# Patient Record
Sex: Female | Born: 1955
Health system: Southern US, Community
[De-identification: ages and names within clinical notes are randomized; demographics above are authoritative.]

## PROBLEM LIST (undated history)

## (undated) DIAGNOSIS — M1990 Unspecified osteoarthritis, unspecified site: Secondary | ICD-10-CM

## (undated) DIAGNOSIS — I10 Essential (primary) hypertension: Secondary | ICD-10-CM

## (undated) DIAGNOSIS — G43909 Migraine, unspecified, not intractable, without status migrainosus: Secondary | ICD-10-CM

## (undated) DIAGNOSIS — E559 Vitamin D deficiency, unspecified: Secondary | ICD-10-CM

## (undated) DIAGNOSIS — K769 Liver disease, unspecified: Secondary | ICD-10-CM

## (undated) DIAGNOSIS — J45909 Unspecified asthma, uncomplicated: Secondary | ICD-10-CM

## (undated) DIAGNOSIS — I472 Ventricular tachycardia, unspecified: Secondary | ICD-10-CM

## (undated) DIAGNOSIS — J40 Bronchitis, not specified as acute or chronic: Secondary | ICD-10-CM

## (undated) DIAGNOSIS — E785 Hyperlipidemia, unspecified: Secondary | ICD-10-CM

## (undated) DIAGNOSIS — J309 Allergic rhinitis, unspecified: Secondary | ICD-10-CM

## (undated) DIAGNOSIS — R1013 Epigastric pain: Secondary | ICD-10-CM

## (undated) DIAGNOSIS — R002 Palpitations: Secondary | ICD-10-CM

## (undated) DIAGNOSIS — I471 Supraventricular tachycardia, unspecified: Secondary | ICD-10-CM

## (undated) DIAGNOSIS — H8110 Benign paroxysmal vertigo, unspecified ear: Secondary | ICD-10-CM

## (undated) DIAGNOSIS — D689 Coagulation defect, unspecified: Secondary | ICD-10-CM

## (undated) DIAGNOSIS — I4891 Unspecified atrial fibrillation: Secondary | ICD-10-CM

## (undated) DIAGNOSIS — G2581 Restless legs syndrome: Secondary | ICD-10-CM

## (undated) HISTORY — DX: Palpitations: R00.2

## (undated) HISTORY — DX: Unspecified asthma, uncomplicated: J45.909

## (undated) HISTORY — DX: Coagulation defect, unspecified: D68.9

## (undated) HISTORY — PX: LAPAROSCOPIC OOPHERECTOMY: SHX6507

## (undated) HISTORY — PX: COLONOSCOPY: SHX174

## (undated) HISTORY — DX: Ventricular tachycardia: I47.2

## (undated) HISTORY — DX: Unspecified osteoarthritis, unspecified site: M19.90

## (undated) HISTORY — DX: Allergic rhinitis, unspecified: J30.9

## (undated) HISTORY — PX: KNEE SURGERY: SHX244

## (undated) HISTORY — PX: ABLATION: SHX5711

## (undated) HISTORY — DX: Bronchitis, not specified as acute or chronic: J40

## (undated) HISTORY — DX: Epigastric pain: R10.13

## (undated) HISTORY — DX: Unspecified atrial fibrillation: I48.91

## (undated) HISTORY — DX: Vitamin D deficiency, unspecified: E55.9

## (undated) HISTORY — DX: Ventricular tachycardia, unspecified: I47.20

## (undated) HISTORY — PX: TONSILLECTOMY: SUR1361

## (undated) HISTORY — DX: Restless legs syndrome: G25.81

## (undated) HISTORY — DX: Migraine, unspecified, not intractable, without status migrainosus: G43.909

---

## 2005-05-14 ENCOUNTER — Ambulatory Visit: Payer: Self-pay | Admitting: Obstetrics and Gynecology

## 2006-01-18 ENCOUNTER — Ambulatory Visit: Payer: Self-pay | Admitting: Gastroenterology

## 2006-11-25 ENCOUNTER — Ambulatory Visit: Payer: Self-pay | Admitting: Obstetrics and Gynecology

## 2007-12-15 ENCOUNTER — Ambulatory Visit: Payer: Self-pay | Admitting: Obstetrics and Gynecology

## 2009-06-13 ENCOUNTER — Ambulatory Visit: Payer: Self-pay | Admitting: Obstetrics and Gynecology

## 2010-10-16 ENCOUNTER — Ambulatory Visit: Payer: Self-pay | Admitting: Internal Medicine

## 2014-08-25 ENCOUNTER — Emergency Department: Payer: Self-pay | Admitting: Emergency Medicine

## 2015-12-23 ENCOUNTER — Ambulatory Visit (INDEPENDENT_AMBULATORY_CARE_PROVIDER_SITE_OTHER): Payer: 59 | Admitting: Cardiology

## 2015-12-23 ENCOUNTER — Encounter (INDEPENDENT_AMBULATORY_CARE_PROVIDER_SITE_OTHER): Payer: Self-pay

## 2015-12-23 ENCOUNTER — Encounter: Payer: Self-pay | Admitting: Cardiology

## 2015-12-23 VITALS — BP 114/64 | HR 77 | Ht 64.0 in | Wt 183.8 lb

## 2015-12-23 DIAGNOSIS — R002 Palpitations: Secondary | ICD-10-CM

## 2015-12-23 DIAGNOSIS — I471 Supraventricular tachycardia: Secondary | ICD-10-CM | POA: Diagnosis not present

## 2015-12-23 DIAGNOSIS — I480 Paroxysmal atrial fibrillation: Secondary | ICD-10-CM

## 2015-12-23 MED ORDER — VERAPAMIL HCL 120 MG PO TABS: 120.0000 mg | ORAL_TABLET | Freq: Once | ORAL | Status: DC

## 2015-12-23 MED ORDER — VERAPAMIL HCL ER 120 MG PO TBCR
120.0000 mg | EXTENDED_RELEASE_TABLET | Freq: Every day | ORAL | Status: DC
Start: 2015-12-23 — End: 2016-05-03

## 2015-12-23 NOTE — Progress Notes (Signed)
Cardiology Office Note   Date:  12/23/2015   ID:  Diane Howe, DOB 1956/01/31, MRN [id number]  Referring Doctor:  Lynnea Ferrier, MD   Cardiologist:   Almond Lint, MD   Reason for consultation:  Chief Complaint  Patient presents with  . other    Palpitations . Meds reviewed verbally with pt.      History of Present Illness: Diane Howe is a 60 y.o. female who presents for Palpitations. Patient would like to establish care with cardiology for history of SVT.  Patient reports a long history with arrhythmia. Beginning in her 33s, she was diagnosed with SVT, and atrial fibrillation. She was follow-up with cardiology at [facility] for these. After failed medical therapy for the SVT, she underwent ablation. Based on medical records, first ablation was made 2003. They determined that arrhythmia was coming from the left side and therefore set her up for another ablation 04/24/2002.  From a visit with Dr. Kennedy Bucker, cardiology at [facility], 08/30/2006: "In May of 2003, she underwent an electrophysiology study for evaluation of a supraventricular tachycardia. Left atrial tachycardia was identified at EP study. Its successful ablation would have required transseptal puncture and it was elected to defer the procedure. The patient was discharged on Verapamil SR 240 mg per day and Atenolol 25 mg per day. The patient was readmitted on 04/24/02 and underwent an ablation. The ablation site was close to the hisbundal. The patient was observed following the procedure and maintained normal conduction. She was discharged home without further medication. "  According to the patient, after the ablation, she noted recurrence of palpitations and SVT. She needed to be on medications for a while. Afterwards, she did well for several years without medications for the SVT. Recently, she has needed when necessary verapamil for episodes of palpitations which she feels is similar to her SVT episodes in the past. This was  under the setting of increased stress at work. Verapamil will improve her palpitations. Her palpitations are described as rapid heart rate, symptoms mainly in the chest, nonradiating, moderate in intensity, lasting minutes at a time, improved with verapamil.  Patient describes another arrhythmia which she attributes to atrial fibrillation. Per medical records, she has paroxysmal atrial fibrillation. She describes this as a sensation of fluttering in her chest associated with lightheadedness. The symptoms last less than 1 minute. Verapamil also helps resolve the symptom. No associated chest pain or shortness of breath. She has been on aspirin for this.  Recently, she underwent stress echocardiogram and Holter for recurrence of palpitations. She was sent to cardiology for further management.   ROS:  Please see the history of present illness. Aside from mentioned under HPI, all other systems are reviewed and negative.     Past Medical History  Diagnosis Date  . A-fib (HCC)   . V tach (HCC)   . Clotting disorder (HCC)   . Asthma   . Bronchitis   . Dyspepsia   . Restless leg syndrome   . Vitamin D deficiency   . Heart palpitations   . Arthritis    She mentions a history of ventricular tachycardia but upon review of extensive medical records from [facility],/cardiology notes, no mention of history ventricular tachycardia  Past Surgical History  Procedure Laterality Date  . Ablation       reports that she has quit smoking. She does not have any smokeless tobacco history on file. She reports that she drinks alcohol. She reports that she does  not use illicit drugs.   family history includes Heart attack in her cousin and maternal grandfather.   Current Outpatient Prescriptions  Medication Sig Dispense Refill  . albuterol (PROVENTIL HFA;VENTOLIN HFA) 108 (90 Base) MCG/ACT inhaler Inhale into the lungs every 6 (six) hours as needed for wheezing or shortness of breath.    Marland Kitchen ascorbic acid (VITAMIN  C) 1000 MG tablet Take 1,000 mg by mouth daily.    Marland Kitchen aspirin 325 MG tablet Take 325 mg by mouth daily.    Marland Kitchen BIOGAIA PROBIOTIC (BIOGAIA PROBIOTIC) LIQD Take by mouth daily at 8 pm.    . busPIRone (BUSPAR) 15 MG tablet Take 7.5 mg by mouth 2 (two) times daily as needed.    . Calcium-Magnesium-Vitamin D 400-166.7-133.3 MG-MG-UNIT TABS Take by mouth daily.    . cholecalciferol (VITAMIN D) 1000 units tablet Take 4,000 Units by mouth daily.    Marland Kitchen etodolac (LODINE) 500 MG tablet Take 500 mg by mouth 2 (two) times daily as needed.    . fexofenadine (ALLEGRA) 180 MG tablet Take 180 mg by mouth daily.    . Multiple Vitamin (MULTIVITAMIN) capsule Take 1 capsule by mouth daily.    . peppermint oil liquid by Does not apply route daily.    . vitamin E 200 UNIT capsule Take 200 Units by mouth daily.    . verapamil (CALAN-SR) 120 MG CR tablet Take 1 tablet (120 mg total) by mouth at bedtime. 30 tablet 6   No current facility-administered medications for this visit.    Allergies: Omeprazole and Diphenhydramine    PHYSICAL EXAM: VS:  BP 114/64 mmHg  Pulse 77  Ht  (1.626 m)  Wt 183 lb 12 oz (83.348 kg)  BMI 31.52 kg/m2 , Body mass index is 31.52 kg/(m^2). Wt Readings from Last 3 Encounters:  12/23/15 183 lb 12 oz (83.348 kg)    GENERAL:  well developed, well nourished, obese, not in acute distress HEENT: normocephalic, pink conjunctivae, anicteric sclerae, no xanthelasma, normal dentition, oropharynx clear NECK:  no neck vein engorgement, JVP normal, no hepatojugular reflux, carotid upstroke brisk and symmetric, no bruit, no thyromegaly, no lymphadenopathy LUNGS:  good respiratory effort, clear to auscultation bilaterally CV:  PMI not displaced, no thrills, no lifts, S1 and S2 within normal limits, no palpable S3 or S4, no murmurs, no rubs, no gallops ABD:  Soft, nontender, nondistended, normoactive bowel sounds, no abdominal aortic bruit, no hepatomegaly, no splenomegaly MS: nontender back, no  kyphosis, no scoliosis, no joint deformities EXT:  2+ DP/PT pulses, no edema, no varicosities, no cyanosis, no clubbing SKIN: warm, nondiaphoretic, normal turgor, no ulcers NEUROPSYCH: alert, oriented to person, place, and time, sensory/motor grossly intact, normal mood, appropriate affect  Recent Labs: No results found for requested labs within last 365 days.   Lipid Panel No results found for: CHOL, TRIG, HDL, CHOLHDL, VLDL, LDLCALC, LDLDIRECT   Other studies Reviewed:  EKG:  EKG Is ordered today. The ekg ordered 12/23/2015 was personally reviewed by me and it revealed sinus rhythm, first-degree AV block. PR interval 232 ms. Minimal voltage criteria for LVH.  Additional studies/ records that were reviewed personally reviewed by me today include:   Holter monitor to 15 2017 as ordered by Dr. Graciela Husbands: Predominant sinus rhythm with periods of sinus bradycardia, sinus tachycardia cannot exclude P on T at higher rates. Sinus arrhythmia. Intermittent first degree AV block. Episode SVT sustained rate ranges approximate 127-165 bpm. Brief episode multifocal atrial tachycardia. Rare PVCs. NN the delapys due to  sinus arrhythmia and compensatory pauses.  Stress echocardiogram 11/15/2015: Normal stress echocardiogram. Normal RV systolic function. Mild valvular regurgitation. No valvular stenosis noted. SVT noted in late exercise. Rhythm strips were not available for review   ASSESSMENT AND PLAN:  Palpitations, SVT Patient will like to go back on medication for this since she has noted more recurrence lately. Recommend verapamil SR 120 mg daily. Recommend a Holter monitor one week after being on this medication. Recommend formal echocardiogram.  Paroxysmal atrial fibrillation Based on review of medical records. CHADS2-VASc= 1 for age. Continue aspirin for now. We'll be starting on verapamil SR daily.     Current medicines are reviewed at length with the patient today.  The patient does not  have concerns regarding medicines.  Labs/ tests ordered today include:  Orders Placed This Encounter  Procedures  . Holter monitor - 24 hour  . EKG 12-Lead  . Echocardiogram    I had a lengthy and detailed discussion with the patient regarding diagnoses, prognosis, diagnostic options, treatment options, and side effects of medications.   I counseled the patient on importance of lifestyle modification including heart healthy diet, regular physical activity. Disposition:   FU with undersigned after tests/In 2 months  I spent at least 60 minutes with the patient today and more than 50% of the time was spent counseling the patient and coordinating care. Significant amount of time was done as well to review extensive medical records from cardiology. This was done in the presence of the patient.     Signed, Almond LintAileen Novella Abraha, MD  12/23/2015 4:50 PM    [facility]

## 2015-12-23 NOTE — Patient Instructions (Addendum)
Medication Instructions:  Your physician has recommended you make the following change in your medication:   Changed verapamil to extended release 120 mg once daily   Labwork: None ordered  Testing/Procedures: Your physician has recommended that you wear a holter monitor. Holter monitors are medical devices that record the heart's electrical activity. Doctors most often use these monitors to diagnose arrhythmias. Arrhythmias are problems with the speed or rhythm of the heartbeat. The monitor is a small, portable device. You can wear one while you do your normal daily activities. This is usually used to diagnose what is causing palpitations/syncope (passing out).  Your physician has requested that you have an echocardiogram. Echocardiography is a painless test that uses sound waves to create images of your heart. It provides your doctor with information about the size and shape of your heart and how well your heart's chambers and valves are working. This procedure takes approximately one hour. There are no restrictions for this procedure.  Date & Time: __________________________________________________________________  Follow-Up: Your physician recommends that you schedule a follow-up appointment in: 2 months with Dr. Alvino Chapel  Date & Time: __________________________________________________________________   Any Other Special Instructions Will Be Listed Below (If Applicable).     If you need a refill on your cardiac medications before your next appointment, please call your pharmacy.  Echocardiogram An echocardiogram, or echocardiography, uses sound waves (ultrasound) to produce an image of your heart. The echocardiogram is simple, painless, obtained within a short period of time, and offers valuable information to your health care provider. The images from an echocardiogram can provide information such as:  Evidence of coronary artery disease (CAD).  Heart size.  Heart muscle  function.  Heart valve function.  Aneurysm detection.  Evidence of a past heart attack.  Fluid buildup around the heart.  Heart muscle thickening.  Assess heart valve function. LET [facility] CARE PROVIDER KNOW ABOUT:  Any allergies you have.  All medicines you are taking, including vitamins, herbs, eye drops, creams, and over-the-counter medicines.  Previous problems you or members of your family have had with the use of anesthetics.  Any blood disorders you have.  Previous surgeries you have had.  Medical conditions you have.  Possibility of pregnancy, if this applies. BEFORE THE PROCEDURE  No special preparation is needed. Eat and drink normally.  PROCEDURE   In order to produce an image of your heart, gel will be applied to your chest and a wand-like tool (transducer) will be moved over your chest. The gel will help transmit the sound waves from the transducer. The sound waves will harmlessly bounce off your heart to allow the heart images to be captured in real-time motion. These images will then be recorded.  You may need an IV to receive a medicine that improves the quality of the pictures. AFTER THE PROCEDURE You may return to your normal schedule including diet, activities, and medicines, unless your health care provider tells you otherwise.   This information is not intended to replace advice given to you by your health care provider. Make sure you discuss any questions you have with your health care provider.   Document Released: 09/11/2000 Document Revised: 10/05/2014 Document Reviewed: 05/22/2013 Elsevier Interactive Patient Education 2016 Elsevier Inc.     Holter Monitoring A Holter monitor is a small device that is used to detect abnormal heart rhythms. It clips to your clothing and is connected by wires to flat, sticky disks (electrodes) that attach to your chest. It is worn continuously  for 24-48 hours. HOME CARE INSTRUCTIONS  Wear your Holter  monitor at all times, even while exercising and sleeping, for as long as directed by your health care provider.  Make sure that the Holter monitor is safely clipped to your clothing or close to your body as recommended by your health care provider.  Do not get the monitor or wires wet.  Do not put body lotion or moisturizer on your chest.  Keep your skin clean.  Keep a diary of your daily activities, such as walking and doing chores. If you feel that your heartbeat is abnormal or that your heart is fluttering or skipping a beat:  Record what you are doing when it happens.  Record what time of day the symptoms occur.  Return your Holter monitor as directed by your health care provider.  Keep all follow-up visits as directed by your health care provider. This is important. SEEK IMMEDIATE MEDICAL CARE IF:  You feel lightheaded or you faint.  You have trouble breathing.  You feel pain in your chest, upper arm, or jaw.  You feel sick to your stomach and your skin is pale, cool, or damp.  You heartbeat feels unusual or abnormal.   This information is not intended to replace advice given to you by your health care provider. Make sure you discuss any questions you have with your health care provider.   Document Released: 06/12/2004 Document Revised: 10/05/2014 Document Reviewed: 04/23/2014 Elsevier Interactive Patient Education [employer] Else[employer] Inc[employer]

## 2015-12-25 ENCOUNTER — Telehealth: Payer: Self-pay | Admitting: Cardiology

## 2015-12-25 NOTE — Telephone Encounter (Signed)
Pt needs Rx: Verapamil 120 SR, qty 1 week, [facility], [name], [place].  She normally gets Verapamil, not SR, through mail order.

## 2015-12-25 NOTE — Telephone Encounter (Signed)
Left voicemail message for patient to call back regarding prescription.

## 2015-12-25 NOTE — Telephone Encounter (Signed)
Spoke with patient and she stated that she received a call from [facility] stated that her medication was available for pick up but it was not the correct one. Called and spoke with Crystal at [facility] and they received her prescription and mailed it out today. Notified patient to call us if she had any other problems.

## 2016-01-02 ENCOUNTER — Ambulatory Visit (INDEPENDENT_AMBULATORY_CARE_PROVIDER_SITE_OTHER): Payer: 59

## 2016-01-02 ENCOUNTER — Other Ambulatory Visit: Payer: Self-pay

## 2016-01-02 DIAGNOSIS — R002 Palpitations: Secondary | ICD-10-CM | POA: Diagnosis not present

## 2016-01-06 ENCOUNTER — Ambulatory Visit: Payer: Self-pay | Admitting: Cardiovascular Disease

## 2016-01-29 ENCOUNTER — Encounter (HOSPITAL_COMMUNITY): Payer: Self-pay | Admitting: Emergency Medicine

## 2016-01-29 ENCOUNTER — Emergency Department (HOSPITAL_COMMUNITY)
Admission: EM | Admit: 2016-01-29 | Discharge: 2016-01-29 | Disposition: A | Discharge status: Home / Self Care | Payer: 59 | Attending: Emergency Medicine | Admitting: Emergency Medicine

## 2016-01-29 ENCOUNTER — Emergency Department (HOSPITAL_COMMUNITY): Payer: 59

## 2016-01-29 DIAGNOSIS — J45909 Unspecified asthma, uncomplicated: Secondary | ICD-10-CM | POA: Insufficient documentation

## 2016-01-29 DIAGNOSIS — E559 Vitamin D deficiency, unspecified: Secondary | ICD-10-CM | POA: Diagnosis not present

## 2016-01-29 DIAGNOSIS — M1990 Unspecified osteoarthritis, unspecified site: Secondary | ICD-10-CM | POA: Diagnosis not present

## 2016-01-29 DIAGNOSIS — Z79899 Other long term (current) drug therapy: Secondary | ICD-10-CM | POA: Insufficient documentation

## 2016-01-29 DIAGNOSIS — Z87891 Personal history of nicotine dependence: Secondary | ICD-10-CM | POA: Insufficient documentation

## 2016-01-29 DIAGNOSIS — I483 Typical atrial flutter: Secondary | ICD-10-CM | POA: Diagnosis not present

## 2016-01-29 DIAGNOSIS — Z7982 Long term (current) use of aspirin: Secondary | ICD-10-CM | POA: Diagnosis not present

## 2016-01-29 DIAGNOSIS — Z8669 Personal history of other diseases of the nervous system and sense organs: Secondary | ICD-10-CM | POA: Diagnosis not present

## 2016-01-29 DIAGNOSIS — R61 Generalized hyperhidrosis: Secondary | ICD-10-CM | POA: Diagnosis not present

## 2016-01-29 DIAGNOSIS — R42 Dizziness and giddiness: Secondary | ICD-10-CM | POA: Diagnosis present

## 2016-01-29 LAB — BASIC METABOLIC PANEL
Anion gap: 11 (ref 5–15)
BUN: 12 mg/dL (ref 6–20)
CHLORIDE: 103 mmol/L (ref 101–111)
CO2: 24 mmol/L (ref 22–32)
CREATININE: 0.72 mg/dL (ref 0.44–1.00)
Calcium: 8.9 mg/dL (ref 8.9–10.3)
Glucose, Bld: 117 mg/dL — ABNORMAL HIGH (ref 65–99)
Potassium: 4.2 mmol/L (ref 3.5–5.1)
SODIUM: 138 mmol/L (ref 135–145)

## 2016-01-29 LAB — I-STAT CHEM 8, ED
BUN: 14 mg/dL (ref 6–20)
CHLORIDE: 104 mmol/L (ref 101–111)
Calcium, Ion: 1.05 mmol/L — ABNORMAL LOW (ref 1.13–1.30)
Creatinine, Ser: 0.7 mg/dL (ref 0.44–1.00)
GLUCOSE: 112 mg/dL — AB (ref 65–99)
HEMATOCRIT: 43 % (ref 36.0–46.0)
Hemoglobin: 14.6 g/dL (ref 12.0–15.0)
POTASSIUM: 4.2 mmol/L (ref 3.5–5.1)
SODIUM: 139 mmol/L (ref 135–145)
TCO2: 21 mmol/L (ref 0–100)

## 2016-01-29 LAB — I-STAT TROPONIN, ED
TROPONIN I, POC: 0 ng/mL (ref 0.00–0.08)
TROPONIN I, POC: 0 ng/mL (ref 0.00–0.08)

## 2016-01-29 LAB — CBC
HCT: 40.7 % (ref 36.0–46.0)
Hemoglobin: 13 g/dL (ref 12.0–15.0)
MCH: 30.4 pg (ref 26.0–34.0)
MCHC: 31.9 g/dL (ref 30.0–36.0)
MCV: 95.3 fL (ref 78.0–100.0)
PLATELETS: 250 10*3/uL (ref 150–400)
RBC: 4.27 MIL/uL (ref 3.87–5.11)
RDW: 13.4 % (ref 11.5–15.5)
WBC: 10.2 10*3/uL (ref 4.0–10.5)

## 2016-01-29 MED ORDER — MECLIZINE HCL 25 MG PO TABS
25.0000 mg | ORAL_TABLET | Freq: Once | ORAL | Status: AC
  Administered 2016-01-29: 25 mg via ORAL
  Filled 2016-01-29: qty 1

## 2016-01-29 MED ORDER — MECLIZINE HCL 25 MG PO TABS: 25.0000 mg | ORAL_TABLET | Freq: Three times a day (TID) | ORAL | Status: DC | PRN

## 2016-01-29 MED ORDER — ONDANSETRON HCL 4 MG/2ML IJ SOLN
4.0000 mg | Freq: Once | INTRAMUSCULAR | Status: AC
  Administered 2016-01-29: 4 mg via INTRAVENOUS
  Filled 2016-01-29: qty 2

## 2016-01-29 NOTE — Discharge Instructions (Signed)
Atrial Flutter Call Dr.Ingal tomorrow to schedule the next available office appointment. Tell her that you were seen in the emergency department tonight for atrial flutter. Take the medication prescribed as needed for dizziness (vertigo). If vertigo continues by next week. Contact Dr.Klein. You may need referral to a specialist. Return if concerned for any reason. Atrial flutter is a heart rhythm that can cause the heart to beat very fast (tachycardia). It originates in the upper chambers of the heart (atria). In atrial flutter, the top chambers of the heart (atria) often beat much faster than the bottom chambers of the heart (ventricles). Atrial flutter has a regular "saw toothed" appearance in an EKG readout. An EKG is a test that records the electrical activity of the heart. Atrial flutter can cause the heart to beat up to 150 beats per minute (BPM). Atrial flutter can either be short lived (paroxysmal) or permanent.  CAUSES  Causes of atrial flutter can be many. Some of these include:  Heart related issues:  Heart attack (myocardial infarction).  Heart failure.  Heart valve problems.  Poorly controlled high blood pressure (hypertension).  After open heart surgery.  Lung related issues:  A blood clot in the lungs (pulmonary embolism).  Chronic obstructive pulmonary disease (COPD). Medications used to treat COPD can attribute to atrial flutter.  Other related causes:  Hyperthyroidism.  Caffeine.  Some decongestant cold medications.  Low electrolyte levels such as potassium or magnesium.  Cocaine. SYMPTOMS  An awareness of your heart beating rapidly (palpitations).  Shortness of breath.  Chest pain.  Low blood pressure (hypotension).  Dizziness or fainting. DIAGNOSIS  Different tests can be performed to diagnose atrial flutter.   An EKG.  Holter monitor. This is a 24-hour recording of your heart rhythm. You will also be given a diary. Write down all symptoms that  you have and what you were doing at the time you experienced symptoms.  Cardiac event monitor. This small device can be worn for up to 30 days. When you have heart symptoms, you will push a button on the device. This will then record your heart rhythm.  Echocardiogram. This is an imaging test to look at your heart. Your caregiver will look at your heart valves and the ventricles.  Stress test. This test can help determine if the atrial flutter is related to exercise or if coronary artery disease is present.  Laboratory studies will look at certain blood levels like:  Complete blood count (CBC).  Potassium.  Magnesium.  Thyroid function. TREATMENT  Treatment of atrial flutter varies. A combination of therapies may be used or sometimes atrial flutter may need only 1 type of treatment.  Lab work: If your blood work, such as your electrolytes (potassium, magnesium) or your thyroid function tests, are abnormal, your caregiver will treat them accordingly.  Medication:  There are several different types of medications that can convert your heart to a normal rhythm and prevent atrial flutter from reoccurring.  Nonsurgical procedures: Nonsurgical techniques may be used to control atrial flutter. Some examples include:  Cardioversion. This technique uses either drugs or an electrical shock to restore a normal heart rhythm:  Cardioversion drugs may be given through an intravenous (IV) line to help "reset" the heart rhythm.  In electrical cardioversion, your caregiver shocks your heart with electrical energy. This helps to reset the heartbeat to a normal rhythm.  Ablation. If atrial flutter is a persistent problem, an ablation may be needed. This procedure is done under mild sedation. High  frequency radio-wave energy is used to destroy the area of heart tissue responsible for atrial flutter. SEEK IMMEDIATE MEDICAL CARE IF:  You have:  Dizziness.  Near fainting or fainting.  Shortness of  breath.  Chest pain or pressure.  Sudden nausea or vomiting.  Profuse sweating. If you have the above symptoms, call your local emergency service immediately! Do not drive yourself to the hospital. MAKE SURE YOU:   Understand these instructions.  Will watch your condition.  Will get help right away if you are not doing well or get worse.   This information is not intended to replace advice given to you by your health care provider. Make sure you discuss any questions you have with your health care provider.   Document Released: 01/31/2009 Document Revised: 10/05/2014 Document Reviewed: 03/29/2015 Elsevier Interactive Patient Education [employer]

## 2016-01-29 NOTE — ED Notes (Signed)
Pt states "I is still dizzy when she moves up in bed."

## 2016-01-29 NOTE — ED Notes (Addendum)
Pt reports feeling lightheaded while at work at 4:30pm.  States she started feeling her heart race, nausea, and vomited x 1.  Pt given Zofran by EMS.  States she currently only feels dizzy.  History of afib and SVT.  Ablation at [facility] approx 8-10 years ago.  She started Verapamil XR 3 weeks ago.

## 2016-01-29 NOTE — ED Provider Notes (Signed)
CSN: [id number]     Arrival date & time 01/29/16  1813 History   None    Chief Complaint  Patient presents with  . Dizziness  . Atrial Fibrillation     (Consider location/radiation/quality/duration/timing/severity/associated sxs/prior Treatment) HPI Patient was at work today at 4:30 PM when she developed sudden onset palpitations, lightheadedness and feeling clammy. She vomited one time while en route by EMS. Palpitations lasted 2 hours, resolved immediately prior to coming here. Nothing makes symptoms better or worse. Prehospital 12-lead obtained by EMS consistent with atrial flutter ` at 60 bpm She denies other associated symptoms EMS treated patient with Zofran intravenously. Presently patient is asymptomatic except feels tired. She denies any chest pain denies shortness of breath denies other associated symptoms. Patient was started on verapamil SR 120 mg 3 weeks ago for recurrent atrial f fib/flutter by her cardiologist Past Medical History  Diagnosis Date  . A-fib (HCC)   . V tach (HCC)   . Clotting disorder (HCC)   . Asthma   . Bronchitis   . Dyspepsia   . Restless leg syndrome   . Vitamin D deficiency   . Heart palpitations   . Arthritis    Past Surgical History  Procedure Laterality Date  . Ablation     Family History  Problem Relation Age of Onset  . Heart attack Maternal Grandfather   . Heart attack Cousin    Social History  Substance Use Topics  . Smoking status: Former Smoker -- 15 years  . Smokeless tobacco: Not on file  . Alcohol Use: Yes  Denies illicit drug use. Drinks 1 alcoholic beverage a few times per week OB History    No data available     Review of Systems  Constitutional: Positive for diaphoresis.  HENT: Negative.   Respiratory: Negative.   Cardiovascular: Positive for palpitations.  Gastrointestinal: Positive for vomiting.  Musculoskeletal: Negative.   Skin: Negative.   Neurological: Negative.   Psychiatric/Behavioral: Negative.   All  other systems reviewed and are negative.     Allergies  Omeprazole and Diphenhydramine  Home Medications   Prior to Admission medications   Medication Sig Start Date End Date Taking? Authorizing Provider  albuterol (PROVENTIL HFA;VENTOLIN HFA) 108 (90 Base) MCG/ACT inhaler Inhale into the lungs every 6 (six) hours as needed for wheezing or shortness of breath.    Historical Provider, MD  ascorbic acid (VITAMIN C) 1000 MG tablet Take 1,000 mg by mouth daily.    Historical Provider, MD  aspirin 325 MG tablet Take 325 mg by mouth daily.    Historical Provider, MD  BIOGAIA PROBIOTIC (BIOGAIA PROBIOTIC) LIQD Take by mouth daily at 8 pm.    Historical Provider, MD  busPIRone (BUSPAR) 15 MG tablet Take 7.5 mg by mouth 2 (two) times daily as needed.    Historical Provider, MD  Calcium-Magnesium-Vitamin D 400-166.7-133.3 MG-MG-UNIT TABS Take by mouth daily.    Historical Provider, MD  cholecalciferol (VITAMIN D) 1000 units tablet Take 4,000 Units by mouth daily.    Historical Provider, MD  etodolac (LODINE) 500 MG tablet Take 500 mg by mouth 2 (two) times daily as needed.    Historical Provider, MD  fexofenadine (ALLEGRA) 180 MG tablet Take 180 mg by mouth daily.    Historical Provider, MD  Multiple Vitamin (MULTIVITAMIN) capsule Take 1 capsule by mouth daily.    Historical Provider, MD  peppermint oil liquid by Does not apply route daily.    Historical Provider, MD  verapamil (CALAN-SR) 120  MG CR tablet Take 1 tablet (120 mg total) by mouth at bedtime. 12/23/15   Almond Lint, MD  vitamin E 200 UNIT capsule Take 200 Units by mouth daily.    Historical Provider, MD   BP 156/82 mmHg  Pulse 74  Temp(Src) 97.7 F (36.5 C) (Oral)  Resp 12  Ht  (1.626 m)  Wt 183 lb (83.008 kg)  BMI 31.40 kg/m2  SpO2 100% Physical Exam  Constitutional: She appears well-developed and well-nourished.  HENT:  Head: Normocephalic and atraumatic.  Eyes: Conjunctivae are normal. Pupils are equal, round, and  reactive to light.  Neck: Neck supple. No tracheal deviation present. No thyromegaly present.  Cardiovascular: Normal rate and regular rhythm.   No murmur heard. Pulmonary/Chest: Effort normal and breath sounds normal.  Abdominal: Soft. Bowel sounds are normal. She exhibits no distension. There is no tenderness.  Musculoskeletal: Normal range of motion. She exhibits no edema or tenderness.  Neurological: She is alert. Coordination normal.  Skin: Skin is warm and dry. No rash noted.  Psychiatric: She has a normal mood and affect.  Nursing note and vitals reviewed.   ED Course  Procedures (including critical care time) Labs Review Labs Reviewed  BASIC METABOLIC PANEL  CBC    Imaging Review No results found. I have personally reviewed and evaluated these images and lab results as part of my medical decision-making.   EKG Interpretation   Date/Time:  Wednesday Jan 29 2016 18:21:23 EDT Ventricular Rate:  73 PR Interval:  286 QRS Duration: 103 QT Interval:  417 QTC Calculation: 459 R Axis:   71 Text Interpretation:  Sinus rhythm Prolonged PR interval Probable left  ventricular hypertrophy No old tracing to compare Confirmed by Ethelda Chick   MD, Saori Umholtz [id number]) on 01/29/2016 6:33:56 PM     9:25 PM patient complained of dizziness and nausea. On reexamination she was vertiginous. Upon sitting up in bed from a supine position she described room spinning with nausea. Symptoms improve when she stared affix spot. On reexamination finger to nose normal motor strength 5 over 5 overall pronator drift normal. At 10 PM she feels much improved after treatment with Zofran and meclizine. Vertigo has resolved. Results for orders placed or performed during the hospital encounter of 01/29/16  Basic metabolic panel  Result Value Ref Range   Sodium 138 135 - 145 mmol/L   Potassium 4.2 3.5 - 5.1 mmol/L   Chloride 103 101 - 111 mmol/L   CO2 24 22 - 32 mmol/L   Glucose, Bld 117 (H) 65 - 99 mg/dL   BUN  12 6 - 20 mg/dL   Creatinine, Ser 6.04 0.44 - 1.00 mg/dL   Calcium 8.9 8.9 - 54.0 mg/dL   GFR calc non Af Amer >60 >60 mL/min   GFR calc Af Amer >60 >60 mL/min   Anion gap 11 5 - 15  CBC  Result Value Ref Range   WBC 10.2 4.0 - 10.5 K/uL   RBC 4.27 3.87 - 5.11 MIL/uL   Hemoglobin 13.0 12.0 - 15.0 g/dL   HCT 98.1 19.1 - 47.8 %   MCV 95.3 78.0 - 100.0 fL   MCH 30.4 26.0 - 34.0 pg   MCHC 31.9 30.0 - 36.0 g/dL   RDW 29.5 62.1 - 30.8 %   Platelets 250 150 - 400 K/uL  I-stat chem 8, ed  Result Value Ref Range   Sodium 139 135 - 145 mmol/L   Potassium 4.2 3.5 - 5.1 mmol/L   Chloride 104  101 - 111 mmol/L   BUN 14 6 - 20 mg/dL   Creatinine, Ser 1.61 0.44 - 1.00 mg/dL   Glucose, Bld 096 (H) 65 - 99 mg/dL   Calcium, Ion 0.45 (L) 1.13 - 1.30 mmol/L   TCO2 21 0 - 100 mmol/L   Hemoglobin 14.6 12.0 - 15.0 g/dL   HCT 40.9 81.1 - 91.4 %  I-stat troponin, ED  Result Value Ref Range   Troponin i, poc 0.00 0.00 - 0.08 ng/mL   Comment 3          I-stat troponin, ED  Result Value Ref Range   Troponin i, poc 0.00 0.00 - 0.08 ng/mL   Comment 3           Dg Chest 2 View  01/29/2016  CLINICAL DATA:  Lightheadedness and palpitations, onset today EXAM: CHEST  2 VIEW COMPARISON:  None FINDINGS: The lungs are clear. The pulmonary vasculature is normal. Heart size is normal. Hilar and mediastinal contours are unremarkable. There is no pleural effusion. IMPRESSION: No active cardiopulmonary disease. Electronically Signed   By: Ellery Plunk M.D.   On: 01/29/2016 19:47    MDM  Case discussed with Dr. Donnie Aho. On-call for Dr. Alvino Chapel, pt's cardiologist. We will make no further medication adjustments presently. Patient is instructed to call Dr. Alvino Chapel tomorrow schedule follow-up appointment Final diagnoses:  None  Vertigo is felt to be peripheral and positional in etiology. She has no associated focal neurologic findings. Clinically patient has benign positional vertigo. I don't feel that imaging is  indicated for vertigo   Plan prescription meclizine Referral Dr.Klein primary care physician if vertigo continues. She will follow-up with Dr.Ingal as outpatient Diagnosis #1 atrial flutter #2 vertigo    Doug Sou, MD 01/29/16 7829

## 2016-02-04 ENCOUNTER — Other Ambulatory Visit: Payer: Self-pay | Admitting: Internal Medicine

## 2016-02-04 ENCOUNTER — Encounter: Payer: Self-pay | Admitting: Cardiology

## 2016-02-04 ENCOUNTER — Ambulatory Visit (INDEPENDENT_AMBULATORY_CARE_PROVIDER_SITE_OTHER): Payer: 59 | Admitting: Cardiology

## 2016-02-04 VITALS — BP 120/76 | HR 65 | Ht 64.0 in | Wt 183.8 lb

## 2016-02-04 DIAGNOSIS — I480 Paroxysmal atrial fibrillation: Secondary | ICD-10-CM

## 2016-02-04 DIAGNOSIS — I471 Supraventricular tachycardia: Secondary | ICD-10-CM | POA: Diagnosis not present

## 2016-02-04 DIAGNOSIS — R002 Palpitations: Secondary | ICD-10-CM

## 2016-02-04 DIAGNOSIS — R1013 Epigastric pain: Secondary | ICD-10-CM

## 2016-02-04 NOTE — Patient Instructions (Signed)
Medication Instructions:  Your physician recommends that you continue on your current medications as directed. Please refer to the Current Medication list given to you today.   Labwork: None ordered  Testing/Procedures: Your physician has recommended that you wear a holter monitor. Holter monitors are medical devices that record the heart's electrical activity. Doctors most often use these monitors to diagnose arrhythmias. Arrhythmias are problems with the speed or rhythm of the heartbeat. The monitor is a small, portable device. You can wear one while you do your normal daily activities. This is usually used to diagnose what is causing palpitations/syncope (passing out).  Feb 05, 2016 at 09:00AM at [facility] [address], [place], [place] (Across from walking track and next to the [employer])  Follow-Up: Your physician recommends that you schedule a follow-up appointment in: 3 months with Dr. Alvino ChapelIngal.  Date & Time: ________________________________________________________________   Any Other Special Instructions Will Be Listed Below (If Applicable).     If you need a refill on your cardiac medications before your next appointment, please call your pharmacy.

## 2016-02-04 NOTE — Progress Notes (Signed)
Cardiology Office Note   Date:  02/04/2016   ID:  Diane Howe, DOB 07/26/1956, MRN [id number]  Referring Doctor:  Lynnea Ferrier, MD   Cardiologist:   Almond Lint, MD   Reason for consultation:  Chief Complaint  Patient presents with  . Atrial Fibrillation    NO CP, SOB OR SWELLING. NO OTHER COMPLAINTS      History of Present Illness: Diane Howe is a 60 y.o. female who presents for ffup after tests  Patient reports that on 01/29/2016, patient was at work when all of a sudden she noted room spinning sensation, felt lightheaded, palpitation, feeling clammy. Also had an episode of nausea and vomiting. EMS was called. The first ambulance did not have a monitor and so they had to call a second ambulance. Per her recollection, she was told that the rhythm strip showed atrial fibrillation. She does not recall the heart rate. The good and also get a good measurement of her heart rate by palpating her radial pulse. By the time she got to the ER, EKG showed sinus rhythm. She remained in sinus rhythm throughout her stay in the ER and she was there for many hours. Her symptoms of room spinning and nausea persisted and she was given several doses of Zofran. She recalls the ER doctor telling her that she probably has vertigo and needs to follow-up with her PCP. Since that time, she continued to have episodes of room spinning sensation and dizziness. She has been on Antivert since then. She has a PCP appointment today as well.   She always has palpitations. Again, she mentions she has 2 different kinds of arrhythmia. She would have palpitations which she feels are SVT related. She takes a deep breath and bears down and the palpitations go away. She has another kind of palpitations which she labels as A. fib because it will not go away with the same maneuvers. Although she cannot make the palpitations disappear, episodes usually do not last very long.  Review of ED note 01/29/2016:Patient was  at work today at 4:30 PM when she developed sudden onset palpitations, lightheadedness and feeling clammy. She vomited one time while en route by EMS. Palpitations lasted 2 hours, resolved immediately prior to coming here. Nothing makes symptoms better or worse. Prehospital 12-lead obtained by EMS consistent with atrial flutter ` at 60 bpm    ROS:  Please see the history of present illness. Aside from mentioned under HPI, all other systems are reviewed and negative.     Past Medical History  Diagnosis Date  . A-fib (HCC)   . V tach (HCC)   . Clotting disorder (HCC)   . Asthma   . Bronchitis   . Dyspepsia   . Restless leg syndrome   . Vitamin D deficiency   . Heart palpitations   . Arthritis    She mentions a history of ventricular tachycardia but upon review of extensive medical records from [facility],/cardiology notes, no mention of history ventricular tachycardia  Past Surgical History  Procedure Laterality Date  . Ablation       reports that she has quit smoking. She does not have any smokeless tobacco history on file. She reports that she drinks alcohol. She reports that she does not use illicit drugs.   family history includes Heart attack in her cousin and maternal grandfather.   Current Outpatient Prescriptions  Medication Sig Dispense Refill  . albuterol (PROVENTIL HFA;VENTOLIN HFA) 108 (90 Base) MCG/ACT inhaler  Inhale 2 puffs into the lungs every 6 (six) hours as needed for wheezing or shortness of breath.     Marland Kitchen. ascorbic acid (VITAMIN C) 1000 MG tablet Take 1,000 mg by mouth daily.    Marland Kitchen. aspirin 325 MG tablet Take 325 mg by mouth daily.    . busPIRone (BUSPAR) 15 MG tablet Take 7.5 mg by mouth 2 (two) times daily as needed (for anxiety).     . Calcium-Magnesium-Vitamin D 400-166.7-133.3 MG-MG-UNIT TABS Take 1 tablet by mouth daily with breakfast.     . cholecalciferol (VITAMIN D) 1000 units tablet Take 4,000 Units by mouth daily.    Marland Kitchen. etodolac (LODINE) 500 MG tablet Take 500  mg by mouth 2 (two) times daily.     . fexofenadine (ALLEGRA) 180 MG tablet Take 180 mg by mouth daily.    . meclizine (ANTIVERT) 25 MG tablet Take 1 tablet (25 mg total) by mouth 3 (three) times daily as needed for dizziness or nausea. 15 tablet 0  . Multiple Vitamins-Minerals (ONE-A-DAY WOMENS 50 PLUS PO) Take 1 tablet by mouth daily with breakfast.    . PEPPERMINT OIL PO Take 1 capsule by mouth every morning.    . Probiotic CAPS Take 1 capsule by mouth daily with breakfast.    . verapamil (CALAN-SR) 120 MG CR tablet Take 1 tablet (120 mg total) by mouth at bedtime. 30 tablet 6  . vitamin E 200 UNIT capsule Take 200 Units by mouth daily.     No current facility-administered medications for this visit.    Allergies: Omeprazole; Diphenhydramine; and Tape    PHYSICAL EXAM: VS:  BP 120/76 mmHg  Pulse 65  Ht 5\' 4"  (1.626 m)  Wt 183 lb 12.8 oz (83.371 kg)  BMI 31.53 kg/m2  LMP 01/22/2016 , Body mass index is 31.53 kg/(m^2). Wt Readings from Last 3 Encounters:  02/04/16 183 lb 12.8 oz (83.371 kg)  01/29/16 183 lb (83.008 kg)  12/23/15 183 lb 12 oz (83.348 kg)    GENERAL:  well developed, well nourished, obese, not in acute distress HEENT: normocephalic, pink conjunctivae, anicteric sclerae, no xanthelasma, normal dentition, oropharynx clear NECK:  no neck vein engorgement, JVP normal, no hepatojugular reflux, carotid upstroke brisk and symmetric, no bruit, no thyromegaly, no lymphadenopathy LUNGS:  good respiratory effort, clear to auscultation bilaterally CV:  PMI not displaced, no thrills, no lifts, S1 and S2 within normal limits, no palpable S3 or S4, no murmurs, no rubs, no gallops ABD:  Soft, nontender, nondistended, normoactive bowel sounds, no abdominal aortic bruit, no hepatomegaly, no splenomegaly MS: nontender back, no kyphosis, no scoliosis, no joint deformities EXT:  2+ DP/PT pulses, no edema, no varicosities, no cyanosis, no clubbing SKIN: warm, nondiaphoretic, normal  turgor, no ulcers NEUROPSYCH: alert, oriented to person, place, and time, sensory/motor grossly intact, normal mood, appropriate affect  Recent Labs: 01/29/2016: BUN 14; Creatinine, Ser 0.70; Hemoglobin 14.6; Platelets 250; Potassium 4.2; Sodium 139   Lipid Panel No results found for: CHOL, TRIG, HDL, CHOLHDL, VLDL, LDLCALC, LDLDIRECT   Other studies Reviewed:  EKG:   The ekg ordered 12/23/2015 was personally reviewed by me and it revealed sinus rhythm, first-degree AV block. PR interval 232 ms. Minimal voltage criteria for LVH.  EKG in the ER 01/29/2016 was reviewed and showed sinus rhythm, first-degree AV block. The EMS rhythm strip is unavailable for review, not available on Epic.  EKG from 02/04/2016 was personally reviewed by me and it showed sinus rhythm with first-degree AV block. Ventricular rate  65 bpm. PR interval 280 ms.  Additional studies/ records that were reviewed personally reviewed by me today include:   Holter monitor 2 15 2017 as ordered by Dr. Graciela Husbands: Predominant sinus rhythm with periods of sinus bradycardia, sinus tachycardia cannot exclude P on T at higher rates. Sinus arrhythmia. Intermittent first degree AV block. Episode SVT sustained rate ranges approximate 127-165 bpm. Brief episode multifocal atrial tachycardia. Rare PVCs. NN  delays due to sinus arrhythmia and compensatory pauses.  Stress echocardiogram 11/15/2015: Normal stress echocardiogram. Normal RV systolic function. Mild valvular regurgitation. No valvular stenosis noted. SVT noted in late exercise. Rhythm strips were not available for review  Echocardiogram 01/02/2016: Left ventricle: The cavity size was normal. Systolic function was  normal. The estimated ejection fraction was in the range of 60%  to 65%. Wall motion was normal; there were no regional wall  motion abnormalities. Left ventricular diastolic function  parameters were normal. - Mitral valve: There was mild regurgitation. - Left  atrium: The atrium was normal in size. - Right ventricle: Systolic function was normal. - Pulmonary arteries: Systolic pressure was within the normal  range. Impressions: - Normal study.  ASSESSMENT AND PLAN:  Palpitations, SVT For now, continue verapamil SR 120 mg daily.  Recommend to proceed with a Holter monitor. Her most recent ER visit is likely related to vertigo. It is possible that she did indeed go into A. fib, she has a history of paroxysmal A. fib. The fact that her symptoms of room spinning, dizziness and nausea persisted longer than the arrhythmia, this points to a separate issue of vertigo. She'll be seeing her PCP for this today.   Paroxysmal atrial fibrillation Based on review of medical records. CHADS2-VASc= 1 for age. Continue aspirin for now. Continue  verapamil SR daily.   Current medicines are reviewed at length with the patient today.  The patient does not have concerns regarding medicines.  Labs/ tests ordered today include:  Orders Placed This Encounter  Procedures  . EKG 12-Lead    I had a lengthy and detailed discussion with the patient regarding diagnoses, prognosis, diagnostic options, treatment options, and side effects of medications.   I counseled the patient on importance of lifestyle modification including heart healthy diet, regular physical activity.  Disposition:   FU with undersigned after tests/In 3 months   Signed, Almond Lint, MD  02/04/2016 10:39 AM    [facility]

## 2016-02-05 ENCOUNTER — Ambulatory Visit (INDEPENDENT_AMBULATORY_CARE_PROVIDER_SITE_OTHER): Payer: 59

## 2016-02-05 DIAGNOSIS — R002 Palpitations: Secondary | ICD-10-CM | POA: Diagnosis not present

## 2016-02-10 ENCOUNTER — Ambulatory Visit: Payer: 59

## 2016-02-12 ENCOUNTER — Other Ambulatory Visit: Payer: Self-pay

## 2016-02-12 DIAGNOSIS — R002 Palpitations: Secondary | ICD-10-CM

## 2016-02-17 ENCOUNTER — Ambulatory Visit
Admission: RE | Admit: 2016-02-17 | Discharge: 2016-02-17 | Disposition: A | Discharge status: Home / Self Care | Payer: 59 | Source: Ambulatory Visit | Attending: Internal Medicine | Admitting: Internal Medicine

## 2016-02-17 DIAGNOSIS — R1013 Epigastric pain: Secondary | ICD-10-CM

## 2016-02-17 DIAGNOSIS — N281 Cyst of kidney, acquired: Secondary | ICD-10-CM | POA: Diagnosis not present

## 2016-02-19 ENCOUNTER — Other Ambulatory Visit: Payer: Self-pay | Admitting: *Deleted

## 2016-02-19 ENCOUNTER — Telehealth: Payer: Self-pay | Admitting: Cardiology

## 2016-02-19 DIAGNOSIS — I471 Supraventricular tachycardia: Secondary | ICD-10-CM

## 2016-02-19 NOTE — Telephone Encounter (Signed)
Duplicate note. See holter monitor results.

## 2016-02-19 NOTE — Telephone Encounter (Signed)
[facility].

## 2016-02-20 ENCOUNTER — Ambulatory Visit: Payer: 59 | Admitting: Cardiology

## 2016-03-26 ENCOUNTER — Ambulatory Visit (INDEPENDENT_AMBULATORY_CARE_PROVIDER_SITE_OTHER): Payer: 59 | Admitting: Internal Medicine

## 2016-03-26 ENCOUNTER — Encounter: Payer: Self-pay | Admitting: Internal Medicine

## 2016-03-26 VITALS — BP 130/74 | HR 64 | Ht 64.0 in | Wt 180.5 lb

## 2016-03-26 DIAGNOSIS — I471 Supraventricular tachycardia: Secondary | ICD-10-CM | POA: Diagnosis not present

## 2016-03-26 MED ORDER — ASPIRIN EC 81 MG PO TBEC: 81.0000 mg | DELAYED_RELEASE_TABLET | Freq: Every day | ORAL | Status: AC

## 2016-03-26 NOTE — Patient Instructions (Signed)
Medication Instructions: - Your physician has recommended you make the following change in your medication:  1) Decrease aspirin to 81 mg once daily  Labwork: - none  Procedures/Testing: - none  Follow-Up: - Dr. Graciela HusbandsKlein will see you back as needed  - We will be in touch with Dr. Macon LargeBahnson ([facility]) about seeing you  Any Additional Special Instructions Will Be Listed Below (If Applicable).     If you need a refill on your cardiac medications before your next appointment, please call your pharmacy.

## 2016-03-26 NOTE — Progress Notes (Signed)
ELECTROPHYSIOLOGY CONSULT NOTE  Patient ID: Diane SiaKathleen M Wince, MRN: [id number], DOB/AGE: 62/11/1955 62 y.o. Admit date: (Not on file) Date of Consult: 03/26/2016  Primary Physician: Lynnea FerrierBERT J Kuulei Kleier III, MD Primary Cardiologist: AI     Consulting Physician AI  Chief Complaint: SVT   HPI Diane Howe is a [age] y.o. female referred for SVT. She says this goes back 30-40 years. She has had 2 separate palpitation syndromes well outlined. The first is abrupt in onset and offset and is something she can terminate with Valsalva. The other is irregular can last hours and is associated with atrial fibrillation. These have been largely quiescent until a few months ago when stress issues at work made them much more problematic.   Records were reviewed from [facility]. The event recorder June 2010 demonstrated 2 distinct tachycardias, the first was atrial fibrillation lasting hours-days. The other was a regular tachycardia terminatable with Valsalva  In 2003 she had undergone EP testing for SVT. A left atrial tachycardia was identified at EP testing; ablation was initially deferred. Ultimately undertaken 7/03.  She reports that she had "7 nodes". Many of them were ablated. The clinic note describes proximity to the His bundle. They did not ablate this one near the His bundle.  She continued to have problems with palpitations over the years. She ended up seeing Dr. Kennedy BuckerGrant in follow-up and was on verapamil. Because of "a problem between the lines" her verapamil dose was down titrated. His notes do not refer to this but her ECG is notable for first degree AV block   Things have been better of late.  Echocardiogram including stress 2/17 was normal  Holter monitor demonstrated a narrow QRS tachycardia as described below; was responsive to Valsalva  She has sleep disordered breathing and daytime fatigue  Past Medical History  Diagnosis Date  . A-fib (HCC)   . V tach (HCC)   . Clotting disorder (HCC)     . Asthma   . Bronchitis   . Dyspepsia   . Restless leg syndrome   . Vitamin D deficiency   . Heart palpitations   . Arthritis       Surgical History:  Past Surgical History  Procedure Laterality Date  . Ablation       Home Meds: Prior to Admission medications   Medication Sig Start Date End Date Taking? Authorizing Provider  albuterol (PROVENTIL HFA;VENTOLIN HFA) 108 (90 Base) MCG/ACT inhaler Inhale 2 puffs into the lungs every 6 (six) hours as needed for wheezing or shortness of breath.    Yes Historical Provider, MD  ascorbic acid (VITAMIN C) 1000 MG tablet Take 1,000 mg by mouth daily.   Yes Historical Provider, MD  aspirin 325 MG tablet Take 325 mg by mouth daily.   Yes Historical Provider, MD  busPIRone (BUSPAR) 15 MG tablet Take 7.5 mg by mouth 2 (two) times daily as needed (for anxiety).    Yes Historical Provider, MD  Calcium-Magnesium-Vitamin D 400-166.7-133.3 MG-MG-UNIT TABS Take 1 tablet by mouth daily with breakfast.    Yes Historical Provider, MD  cholecalciferol (VITAMIN D) 1000 units tablet Take 4,000 Units by mouth daily.   Yes Historical Provider, MD  etodolac (LODINE) 500 MG tablet Take 500 mg by mouth 2 (two) times daily.    Yes Historical Provider, MD  fexofenadine (ALLEGRA) 180 MG tablet Take 180 mg by mouth daily.   Yes Historical Provider, MD  meclizine (ANTIVERT) 25 MG tablet Take 1 tablet (25 mg  total) by mouth 3 (three) times daily as needed for dizziness or nausea. 01/29/16  Yes Doug SouSam Jacubowitz, MD  Multiple Vitamins-Minerals (ONE-A-DAY WOMENS 50 PLUS PO) Take 1 tablet by mouth daily with breakfast.   Yes Historical Provider, MD  PEPPERMINT OIL PO Take 1 capsule by mouth every morning.   Yes Historical Provider, MD  Probiotic CAPS Take 1 capsule by mouth daily with breakfast.   Yes Historical Provider, MD  verapamil (CALAN-SR) 120 MG CR tablet Take 1 tablet (120 mg total) by mouth at bedtime. 12/23/15  Yes Almond LintAileen Ingal, MD  vitamin E 200 UNIT capsule Take 200  Units by mouth daily.   Yes Historical Provider, MD    Allergies:  Allergies  Allergen Reactions  . Omeprazole Diarrhea  . Diphenhydramine Palpitations  . Tape Rash    Can only wear tape and BandAids short-term, as they make her skin break out    Social History   Social History  . Marital Status: Divorced    Spouse Name: N/A  . Number of Children: N/A  . Years of Education: N/A   Occupational History  . Not on file.   Social History Main Topics  . Smoking status: Former Smoker -- 15 years  . Smokeless tobacco: Not on file  . Alcohol Use: Yes  . Drug Use: No  . Sexual Activity: Not on file   Other Topics Concern  . Not on file   Social History Narrative     Family History  Problem Relation Age of Onset  . Heart attack Maternal Grandfather   . Heart attack Cousin      ROS:  Please see the history of present illness.     All other systems reviewed and negative.    Physical Exam:   Blood pressure 130/74, pulse 64, height 5\' 4"  (1.626 m), weight 180 lb 8 oz (81.874 kg), last menstrual period 01/22/2016. General: Well developed, well nourished female in no acute distress. Head: Normocephalic, atraumatic, sclera non-icteric, no xanthomas, nares are without discharge. EENT: normal  Lymph Nodes:  none Neck: Negative for carotid bruits. JVD not elevated. Back:without scoliosis kyphosis  Lungs: Clear bilaterally to auscultation without wheezes, rales, or rhonchi. Breathing is unlabored. Heart: RRR with S1 S2. No murmur . No rubs, or gallops appreciated. Abdomen: Soft, non-tender, non-distended with normoactive bowel sounds. No hepatomegaly. No rebound/guarding. No obvious abdominal masses. Msk:  Strength and tone appear normal for age. Extremities: No clubbing or cyanosis. No edema.  Distal pedal pulses are 2+ and equal bilaterally. Skin: Warm and Dry Neuro: Alert and oriented X 3. CN III-XII intact Grossly normal sensory and motor function . Psych:  Responds to  questions appropriately with a normal affect.      Labs: Cardiac Enzymes No results for input(s): CKTOTAL, CKMB, TROPONINI in the last 72 hours. CBC Lab Results  Component Value Date   WBC 10.2 01/29/2016   HGB 14.6 01/29/2016   HCT 43.0 01/29/2016   MCV 95.3 01/29/2016   PLT 250 01/29/2016   PROTIME: No results for input(s): LABPROT, INR in the last 72 hours. Chemistry No results for input(s): NA, K, CL, CO2, BUN, CREATININE, CALCIUM, PROT, BILITOT, ALKPHOS, ALT, AST, GLUCOSE in the last 168 hours.  Invalid input(s): LABALBU Lipids No results found for: CHOL, HDL, LDLCALC, TRIG BNP No results found for: PROBNP Thyroid Function Tests: No results for input(s): TSH, T4TOTAL, T3FREE, THYROIDAB in the last 72 hours.  Invalid input(s): FREET3 Miscellaneous No results found for: DDIMER  Radiology/Studies:  No results found.  EKG:  Sinus rhythm at 62 Intervals 34/10/44    Holter monitor demonstrates frequent atrial ectopy. It is also a run of supraventricular tachycardia that is short RP; P wave is described in the early to mid portion of the ST segment. He is initiated with complex atrial ectopy. His termination is not recorded  Assessment and Plan:   Atrial fibrillation-paroxysmal  SVT-long RP  Sleep disordered breathing  First-degree AV block    The patient has recurrent tachycardia with atrial fibrillation short RP tachycardia and a previous EP study at [facility] that demonstrated atrial foci. Her first degree AV block makes choices of antiarrhythmic therapy difficult and I think the most reasonable drug would be dofetilide. An alternative approach might be to consider repeat EP testing and ablation with increases of experience and technology her atrial fibrillation and her tachycardia foci might well be safely ablated. [facility] has a cryo-system. With apparent Hisian tachycardia this might make their facility potentially more attractive.  I will be in touch with Dr.  Macon Large at [facility] to see whether he would be willing to see her. His vein is on her chart from a decade ago.  I suggested that she have follow-up for her to sleep disordered breathing  Sherryl Manges

## 2016-03-27 ENCOUNTER — Telehealth: Payer: Self-pay | Admitting: *Deleted

## 2016-03-27 NOTE — Telephone Encounter (Signed)
-----   Message from [facility] SalviaSteven C Klein, MD sent at 03/27/2016  5:41 PM EDT ----- Yes  They will contact her  ----- Message -----    From: Jefferey PicaHeather C Timesha Cervantez, RN    Sent: 03/27/2016   9:59 AM      To: [facility] SalviaSteven C Klein, MD, Jefferey PicaHeather C Brandyn Thien, RN  Did you get in touch with Dr. Macon LargeBahnson about seeing this patient?

## 2016-05-03 ENCOUNTER — Other Ambulatory Visit: Payer: Self-pay | Admitting: Cardiology

## 2016-05-07 ENCOUNTER — Ambulatory Visit: Payer: 59 | Admitting: Cardiology

## 2016-09-17 ENCOUNTER — Telehealth: Payer: Self-pay | Admitting: Cardiology

## 2016-09-17 NOTE — Telephone Encounter (Signed)
Pt asking if she can switch providers  She saw Dr Alvino ChapelIngal who then referred patient to Dr Graciela HusbandsKlein who then referred patient back to [facility]  Pt states [facility] has now released her from them and now she wants to come back  But would like to see Dr Mariah MillingGollan Please advise if we can schedule with him.

## 2016-09-17 NOTE — Telephone Encounter (Signed)
No answer. Patient wants to switch from Dr Alvino ChapelIngal to Dr Mariah MillingGollan.  Left message detailed message, ok per DPR, with policy that we are to have each doctor sign off on the transfer of service to a new provider.  Let her know it may take a week or so for the final work and to check back with us on the process and to call back with any further questions.  Will route to Dr Alvino ChapelIngal and Dr Mariah MillingGollan to advise on switching.

## 2016-09-24 NOTE — Telephone Encounter (Signed)
She has predominantly electrical issues and probably needs to continue to see Dr. Graciela HusbandsKlein Happy to see her for general cardiac issues Again would likely defer all EP issues back to Dr. Graciel[facility] HusbandsKlein [facility]

## 2016-09-25 NOTE — Telephone Encounter (Signed)
I am ok with switch

## 2016-09-29 NOTE — Telephone Encounter (Signed)
Called patient  She is okay with see Dr Graciela HusbandsKlein She is coming 09/29/16 to see him

## 2016-10-29 ENCOUNTER — Ambulatory Visit (INDEPENDENT_AMBULATORY_CARE_PROVIDER_SITE_OTHER): Payer: 59 | Admitting: Internal Medicine

## 2016-10-29 ENCOUNTER — Encounter: Payer: Self-pay | Admitting: Internal Medicine

## 2016-10-29 VITALS — BP 122/80 | HR 86 | Ht 64.0 in | Wt 191.2 lb

## 2016-10-29 DIAGNOSIS — I480 Paroxysmal atrial fibrillation: Secondary | ICD-10-CM

## 2016-10-29 DIAGNOSIS — I471 Supraventricular tachycardia: Secondary | ICD-10-CM | POA: Diagnosis not present

## 2016-10-29 DIAGNOSIS — I440 Atrioventricular block, first degree: Secondary | ICD-10-CM

## 2016-10-29 MED ORDER — VERAPAMIL HCL ER 120 MG PO TBCR: EXTENDED_RELEASE_TABLET | ORAL | 3 refills | Status: DC

## 2016-10-29 NOTE — Patient Instructions (Addendum)
Medication Instructions: - Your physician has recommended you make the following change in your medication:  1) Stop Flecainide 2) Resume Verapamil 120 mg- take one tablet by mouth once daily  Labwork: - none ordered  Procedures/Testing: - none ordered  Follow-Up: - Your physician recommends that you schedule a follow-up appointment in: 3 months with Dr. Graciela HusbandsKlein.  Any Additional Special Instructions Will Be Listed Below (If Applicable).     If you need a refill on your cardiac medications before your next appointment, please call your pharmacy.

## 2016-10-29 NOTE — Progress Notes (Signed)
Patient Care Team: Diane FerrierBert J Jaiyden Laur III, MD as PCP - General (Internal Medicine)   HPI  Diane Howe is a 61 y.o. female Seen in follow-up for recurrent atrial arrhythmias.  She had been seen 6/17. 2 distinct tachycardias and been noted 2010 including Valsalva terminated SVT and atrial fibrillation. She had undergone EP testing 2003 where "7 nodes" identified and ablated. There was apparently Hisian FOCUS which was not ablated.  She had been on verapamil. Holter monitor had demonstrated frequent atrial ectopy as well as a short RP tachycardia, this occurring in the context of known first-degree AV block (PR interval 340) She was referred back to [facility]  >> Report --- Multiple EAT's in [place], and primary and secondary AF including ERAF after DCCV despite ibutilide. No ablation done Verapamil was discontinued and atenolol was initiated as well as flecainide. She was started on apixoban  She did not tolerate this well and after follow-up evaluation , and redetermine of her thromboembolic risk it was discontinued.   Her thromboembolic risk profile is notable for gender for a CHADS-VASc score of 0 (1).  She has not tolerated the flecainide very well. She did not tolerate the atenolol at all. That has been discontinued.  She has sleep disordered breathing. He has some daytime somnolence.   Records and Results Reviewed As above *  Past Medical History:  Diagnosis Date  . A-fib (HCC)   . Arthritis   . Asthma   . Bronchitis   . Clotting disorder (HCC)   . Dyspepsia   . Heart palpitations   . Restless leg syndrome   . V tach (HCC)   . Vitamin D deficiency     Past Surgical History:  Procedure Laterality Date  . ABLATION      Current Outpatient Prescriptions  Medication Sig Dispense Refill  . albuterol (PROVENTIL HFA;VENTOLIN HFA) 108 (90 Base) MCG/ACT inhaler Inhale 2 puffs into the lungs every 6 (six) hours as needed for wheezing or shortness of breath.     Marland Kitchen. ascorbic acid  (VITAMIN C) 1000 MG tablet Take 1,000 mg by mouth daily.    Marland Kitchen. aspirin EC 81 MG tablet Take 1 tablet (81 mg total) by mouth daily.    . busPIRone (BUSPAR) 15 MG tablet Take 7.5 mg by mouth 2 (two) times daily as needed (for anxiety).     . Calcium-Magnesium-Vitamin D 400-166.7-133.3 MG-MG-UNIT TABS Take 1 tablet by mouth daily with breakfast.     . cholecalciferol (VITAMIN D) 1000 units tablet Take 4,000 Units by mouth daily.    Marland Kitchen. etodolac (LODINE) 500 MG tablet Take 500 mg by mouth 2 (two) times daily.     . fexofenadine (ALLEGRA) 180 MG tablet Take 180 mg by mouth daily.    . flecainide (TAMBOCOR) 150 MG tablet Take 75 mg by mouth 2 (two) times daily.    . meclizine (ANTIVERT) 25 MG tablet Take 1 tablet (25 mg total) by mouth 3 (three) times daily as needed for dizziness or nausea. 15 tablet 0  . Multiple Vitamins-Minerals (ONE-A-DAY WOMENS 50 PLUS PO) Take 1 tablet by mouth daily with breakfast.    . PEPPERMINT OIL PO Take 1 capsule by mouth every morning.    . Probiotic CAPS Take 1 capsule by mouth daily with breakfast.    . vitamin E 200 UNIT capsule Take 200 Units by mouth daily.     No current facility-administered medications for this visit.     Allergies  Allergen  Reactions  . Apixaban     Other reaction(s): Other (See Comments) Leg tingling  . Omeprazole Diarrhea  . Diphenhydramine Palpitations  . Tape Rash    Can only wear tape and BandAids short-term, as they make her skin break out      Review of Systems negative except from HPI and PMH  Physical Exam Ht 5\' 4"  (1.626 m)   Wt 191 lb 4 oz (86.8 kg)   LMP 01/22/2016   BMI 32.83 kg/m  Well developed and well nourished in no acute distress HENT normal E scleral and icterus clear Neck Supple JVP flat; carotids brisk and full Clear to ausculation irregularly irregular rate and rhythm, no murmurs gallops or rub Soft with active bowel sounds No clubbing cyanosis  Edema Alert and oriented, grossly normal motor and  sensory function Skin Warm and Dry  ECG demonstrates atypical atrial flutter with variable conduction. Rate is 86 Intervals-/11/40 T changes  Assessment and  Plan Atrial fibrillation-persistent  SVT-long RP  Sleep disordered breathing  First-degree AV block   EP testing confirmed atrial fibrillation and may be ectopic foci; medical therapy was recommended. She has not been able to tolerate flecainide. She would like to return to verapamil. We'll do that. She can take an extra one as needed.  We again discussed aspirin. With her thromboembolic risk profile is notable for a CHADS-VAS of 0 (1) there is no indication for antiplatelet therapy;  I've been in touch with her primary care physician to come up with a consensus recommendation.  I've encouraged her to pursue a sleep study.  At this point with the absence of symptoms, we'll not pursue cardioversion. Alternative drug therapy in the event that her symptoms dictate would be dofetilide or propafenone  See her in 3 months.      Current medicines are reviewed at length with the patient today .  The patient does  have concerns regarding medicines As above .

## 2016-11-11 DIAGNOSIS — Z1211 Encounter for screening for malignant neoplasm of colon: Secondary | ICD-10-CM | POA: Diagnosis not present

## 2016-11-11 DIAGNOSIS — K219 Gastro-esophageal reflux disease without esophagitis: Secondary | ICD-10-CM | POA: Diagnosis not present

## 2017-01-15 ENCOUNTER — Encounter: Payer: Self-pay | Admitting: Obstetrics & Gynecology

## 2017-01-18 ENCOUNTER — Ambulatory Visit: Payer: Self-pay | Admitting: Obstetrics & Gynecology

## 2017-01-25 ENCOUNTER — Other Ambulatory Visit: Payer: Self-pay | Admitting: Internal Medicine

## 2017-01-25 ENCOUNTER — Other Ambulatory Visit: Payer: Self-pay | Admitting: Obstetrics & Gynecology

## 2017-01-25 DIAGNOSIS — Z1231 Encounter for screening mammogram for malignant neoplasm of breast: Secondary | ICD-10-CM

## 2017-02-10 ENCOUNTER — Ambulatory Visit
Admission: RE | Admit: 2017-02-10 | Discharge: 2017-02-10 | Disposition: A | Discharge status: Home / Self Care | Payer: 59 | Source: Ambulatory Visit | Attending: Internal Medicine | Admitting: Internal Medicine

## 2017-02-10 ENCOUNTER — Ambulatory Visit (INDEPENDENT_AMBULATORY_CARE_PROVIDER_SITE_OTHER): Payer: 59 | Admitting: Obstetrics & Gynecology

## 2017-02-10 ENCOUNTER — Encounter: Payer: Self-pay | Admitting: Obstetrics & Gynecology

## 2017-02-10 VITALS — BP 120/80 | HR 69 | Ht 64.0 in | Wt 190.0 lb

## 2017-02-10 DIAGNOSIS — Z0000 Encounter for general adult medical examination without abnormal findings: Secondary | ICD-10-CM

## 2017-02-10 DIAGNOSIS — Z1239 Encounter for other screening for malignant neoplasm of breast: Secondary | ICD-10-CM

## 2017-02-10 DIAGNOSIS — Z01419 Encounter for gynecological examination (general) (routine) without abnormal findings: Secondary | ICD-10-CM | POA: Diagnosis not present

## 2017-02-10 DIAGNOSIS — Z1231 Encounter for screening mammogram for malignant neoplasm of breast: Secondary | ICD-10-CM

## 2017-02-10 NOTE — Patient Instructions (Signed)
Option for Hot Flashes if worsen---       Clonidine skin patches What is this medicine? CLONIDINE (KLOE ni deen) is used to treat high blood pressure. This medicine may be used for other purposes; ask your health care provider or pharmacist if you have questions. COMMON BRAND NAME(S): Catapres-TTS What should I tell my health care provider before I take this medicine? They need to know if you have any of these conditions: -kidney disease -an unusual or allergic reaction to clonidine, other medicines, foods, dyes, or preservatives -pregnant or trying to get pregnant -breast-feeding How should I use this medicine? This medicine is for external use only. Follow the directions on the prescription label. Apply the patch to an area of the upper arm or part of the body that is clean, dry and hairless. Avoid injured, irritated, calloused, or scarred areas. Use a different site each time to prevent skin irritation. Do not cut or trim the patch. One patch should last for 7 days. Do not use your medicine more often than directed. Do not stop using except on the advice of your doctor or health care professional. You must gradually reduce the dose or you may get a dangerous increase in blood pressure. Talk to your pediatrician regarding the use of this medicine in children. Special care may be needed. Overdosage: If you think you have taken too much of this medicine contact a poison control center or emergency room at once. NOTE: This medicine is only for you. Do not share this medicine with others. What if I miss a dose? Replace each patch on the same day of each week, or if the patch falls off. If you do forget to change the patch for two or three days, check with your doctor or health care professional. What may interact with this medicine? Do not take this medicine with any of the following medications: -MAOIs like Carbex, Eldepryl, Marplan, Nardil, and Parnate This medicine may also interact with the  following medications: -barbiturate medicines for inducing sleep or treating seizures like phenobarbital -certain medicines for blood pressure, heart disease, irregular heart beat -certain medicines for depression, anxiety, or psychotic disturbances -prescription pain medicines This list may not describe all possible interactions. Give your health care provider a list of all the medicines, herbs, non-prescription drugs, or dietary supplements you use. Also tell them if you smoke, drink alcohol, or use illegal drugs. Some items may interact with your medicine. What should I watch for while using this medicine? Visit your doctor or health care professional for regular checks on your progress. Check your heart rate and blood pressure regularly while you are using this medicine. Ask your doctor or health care professional what your heart rate should be and when you should contact him or her. You can shower or bathe with the skin patch in position. If the patch gets loose, cover it with the extra adhesive overlay provided. You may get drowsy or dizzy. Do not drive, use machinery, or do anything that needs mental alertness until you know how this medicine affects you. To avoid dizzy or fainting spells, do not stand or sit up quickly, especially if you are an older person. Alcohol can make you more drowsy and dizzy. Avoid alcoholic drinks. Your mouth may get dry. Chewing sugarless gum or sucking hard candy, and drinking plenty of water will help. Do not treat yourself for coughs, colds, or pain while you are using this medicine without asking your doctor or health care professional for  advice. Some ingredients may increase your blood pressure. If you are going to have surgery tell your doctor or health care professional that you are using this medicine. If you are going to have a magnetic resonance imaging (MRI) procedure, tell your MRI technician if you have this patch on your body. It must be removed before  a MRI. What side effects may I notice from receiving this medicine? Side effects that you should report to your doctor or health care professional as soon as possible: -allergic reactions like skin rash, itching or hives, swelling of the face, lips, or tongue -anxiety, nervousness -chest pain -depression -fast, irregular heartbeat -swelling of feet or legs -unusually weak or tired Side effects that usually do not require medical attention (report to your doctor or health care professional if they continue or are bothersome): -change in sex drive or performance -constipation -headache -skin redness, irritation, or darkening under the patch area This list may not describe all possible side effects. Call your doctor for medical advice about side effects. You may report side effects to FDA at 1-800-FDA-1088. Where should I keep my medicine? Keep out of the reach of children. Store at room temperature between 15 and 30 degrees C (59 to 86 degrees F). Throw away any unused medicine after the expiration date. NOTE: This sheet is a summary. It may not cover all possible information. If you have questions about this medicine, talk to your doctor, pharmacist, or health care provider.  2018 Elsevier/Gold Standard (2015-05-27 16:10:[id number]:28)

## 2017-02-10 NOTE — Progress Notes (Signed)
HPI:      [name] is a 61 y.o. [id number] who LMP was in the past, she presents today for her annual examination.  The patient has no complaints today. The patient is sexually active. Herlast pap: was normal and last mammogram: was normal.  The patient does perform self breast exams.  There is no notable family history of breast or ovarian cancer in her family. The patient is NOT taking hormone replacement therapy. Patient denies post-menopausal vaginal bleeding.   The patient has regular exercise: yes. The patient denies current symptoms of depression.    GYN Hx: Last Colonoscopy:10 years ago. Normal.  Last DEXA: never ago.    PMHx: Past Medical History:  Diagnosis Date  . A-fib (HCC)   . Allergic rhinitis   . Arthritis   . Asthma   . Bronchitis   . Clotting disorder (HCC)   . Dyspepsia   . Heart palpitations   . Osteoarthrosis   . Restless leg syndrome   . V tach (HCC)   . Vitamin D deficiency    Past Surgical History:  Procedure Laterality Date  . ABLATION    . CESAREAN SECTION    . COLONOSCOPY    . KNEE SURGERY    . TONSILLECTOMY     Family History  Problem Relation Age of Onset  . Alzheimer's disease Mother   . Diabetes Mellitus II Mother   . Throat cancer Father   . Heart attack Maternal Grandfather   . Heart attack Cousin   . Breast cancer Paternal Aunt    Social History  Substance Use Topics  . Smoking status: Former Smoker    Years: 15.00  . Smokeless tobacco: Never Used  . Alcohol use Yes    Current Outpatient Prescriptions:  .  albuterol (PROVENTIL HFA;VENTOLIN HFA) 108 (90 Base) MCG/ACT inhaler, Inhale 2 puffs into the lungs every 6 (six) hours as needed for wheezing or shortness of breath. , Disp: , Rfl:  .  ascorbic acid (VITAMIN C) 1000 MG tablet, Take 1,000 mg by mouth daily., Disp: , Rfl:  .  aspirin EC 81 MG tablet, Take 1 tablet (81 mg total) by mouth daily., Disp: , Rfl:  .  busPIRone (BUSPAR) 15 MG tablet, Take 7.5 mg by mouth 2  (two) times daily as needed (for anxiety). , Disp: , Rfl:  .  Calcium-Magnesium-Vitamin D 400-166.7-133.3 MG-MG-UNIT TABS, Take 1 tablet by mouth daily with breakfast. , Disp: , Rfl:  .  cholecalciferol (VITAMIN D) 1000 units tablet, Take 4,000 Units by mouth daily., Disp: , Rfl:  .  etodolac (LODINE) 500 MG tablet, Take 500 mg by mouth 2 (two) times daily. , Disp: , Rfl:  .  fexofenadine (ALLEGRA) 180 MG tablet, Take 180 mg by mouth daily., Disp: , Rfl:  .  meclizine (ANTIVERT) 25 MG tablet, Take 1 tablet (25 mg total) by mouth 3 (three) times daily as needed for dizziness or nausea., Disp: 15 tablet, Rfl: 0 .  Multiple Vitamins-Minerals (ONE-A-DAY WOMENS 50 PLUS PO), Take 1 tablet by mouth daily with breakfast., Disp: , Rfl:  .  PEPPERMINT OIL PO, Take 1 capsule by mouth every morning., Disp: , Rfl:  .  Probiotic CAPS, Take 1 capsule by mouth daily with breakfast., Disp: , Rfl:  .  verapamil (CALAN-SR) 120 MG CR tablet, Take one tablet (120 mg) by mouth once daily, Disp: 90 tablet, Rfl: 3 .  vitamin E 200 UNIT capsule, Take 200 Units by mouth daily., Disp: ,  Rfl:  Allergies: Apixaban; Omeprazole; Diphenhydramine; and Tape  Review of Systems  Constitutional: Negative for chills, fever and malaise/fatigue.  HENT: Negative for congestion, sinus pain and sore throat.   Eyes: Negative for blurred vision and pain.  Respiratory: Negative for cough and wheezing.   Cardiovascular: Negative for chest pain and leg swelling.  Gastrointestinal: Negative for abdominal pain, constipation, diarrhea, heartburn, nausea and vomiting.  Genitourinary: Negative for dysuria, frequency, hematuria and urgency.  Musculoskeletal: Negative for back pain, joint pain, myalgias and neck pain.  Skin: Negative for itching and rash.  Neurological: Negative for dizziness, tremors and weakness.  Endo/Heme/Allergies: Does not bruise/bleed easily.  Psychiatric/Behavioral: Negative for depression. The patient is not  nervous/anxious and does not have insomnia.     Objective: BP 120/80   Pulse 69   Ht 5\' 4"  (1.626 m)   Wt 190 lb (86.2 kg)   LMP 01/22/2016   BMI 32.61 kg/m   Filed Weights   02/10/17 1046  Weight: 190 lb (86.2 kg)   Body mass index is 32.61 kg/m. Physical Exam  Constitutional: She is oriented to person, place, and time. She appears well-developed and well-nourished. No distress.  Genitourinary: Rectum normal, vagina normal and uterus normal. Pelvic exam was performed with patient supine. There is no rash or lesion on the right labia. There is no rash or lesion on the left labia. Vagina exhibits no lesion. No bleeding in the vagina. Right adnexum does not display mass and does not display tenderness. Left adnexum does not display mass and does not display tenderness. Cervix does not exhibit motion tenderness, lesion, friability or polyp.   Uterus is mobile and midaxial. Uterus is not enlarged or exhibiting a mass.  HENT:  Head: Normocephalic and atraumatic. Head is without laceration.  Right Ear: Hearing normal.  Left Ear: Hearing normal.  Nose: No epistaxis.  No foreign bodies.  Mouth/Throat: Uvula is midline, oropharynx is clear and moist and mucous membranes are normal.  Eyes: Pupils are equal, round, and reactive to light.  Neck: Normal range of motion. Neck supple. No thyromegaly present.  Cardiovascular: Normal rate and regular rhythm.  Exam reveals no gallop and no friction rub.   No murmur heard. Pulmonary/Chest: Effort normal and breath sounds normal. No respiratory distress. She has no wheezes. Right breast exhibits no mass, no skin change and no tenderness. Left breast exhibits no mass, no skin change and no tenderness.  Abdominal: Soft. Bowel sounds are normal. She exhibits no distension. There is no tenderness. There is no rebound.  Musculoskeletal: Normal range of motion.  Neurological: She is alert and oriented to person, place, and time. No cranial nerve deficit.    Skin: Skin is warm and dry.  Psychiatric: She has a normal mood and affect. Judgment normal.  Vitals reviewed.   Assessment: Annual Exam 1. Annual physical exam   2. Screening for breast cancer     Plan:            1.  Cervical Screening-  Pap smear schedule reviewed with patient, every three years, due 2020  2. Breast screening- Exam annually and mammogram scheduled  3. Colonoscopy every 10 years, Hemoccult testing after age 40  4. Labs managed by PCP  5. Counseling for hormonal therapy: none.  Options for non-hormonal hot flash therapy discussed  6. BV/Trich test for d/c and prior history    F/U  Return in about 1 year (around 02/10/2018) for Annual.  Annamarie Major, MD, [facility] Ob[facility], [facility] 02/10/2017  11:20 AM

## 2017-02-11 ENCOUNTER — Ambulatory Visit: Payer: 59 | Admitting: Internal Medicine

## 2017-02-13 LAB — NUSWAB VAGINITIS PLUS (VG+)
CANDIDA ALBICANS, NAA: NEGATIVE
CANDIDA GLABRATA, NAA: NEGATIVE
Chlamydia trachomatis, NAA: NEGATIVE
NEISSERIA GONORRHOEAE, NAA: NEGATIVE
TRICH VAG BY NAA: NEGATIVE

## 2017-02-23 ENCOUNTER — Encounter: Payer: Self-pay | Admitting: Internal Medicine

## 2017-02-23 NOTE — Progress Notes (Signed)
Request for clearance received from Dr. Marva PandaSkulskie for colonoscopy pending o 03/19/17. Clearance signed by Dr. Graciela HusbandsKlein and faxed to [phone]- confirmation received.

## 2017-03-08 DIAGNOSIS — E784 Other hyperlipidemia: Secondary | ICD-10-CM | POA: Diagnosis not present

## 2017-03-08 DIAGNOSIS — I480 Paroxysmal atrial fibrillation: Secondary | ICD-10-CM | POA: Diagnosis not present

## 2017-03-16 ENCOUNTER — Telehealth: Payer: Self-pay | Admitting: Internal Medicine

## 2017-03-16 DIAGNOSIS — I480 Paroxysmal atrial fibrillation: Secondary | ICD-10-CM | POA: Diagnosis not present

## 2017-03-16 DIAGNOSIS — G5702 Lesion of sciatic nerve, left lower limb: Secondary | ICD-10-CM | POA: Diagnosis not present

## 2017-03-16 DIAGNOSIS — I471 Supraventricular tachycardia: Secondary | ICD-10-CM | POA: Diagnosis not present

## 2017-03-16 NOTE — Telephone Encounter (Signed)
Received records request [facility] , forwarded to [employer] for processing ° °

## 2017-03-18 ENCOUNTER — Encounter: Payer: Self-pay | Admitting: *Deleted

## 2017-03-19 ENCOUNTER — Encounter
Admission: RE | Disposition: A | Discharge status: Home / Self Care | Payer: Self-pay | Source: Ambulatory Visit | Attending: Gastroenterology

## 2017-03-19 ENCOUNTER — Ambulatory Visit: Payer: 59 | Admitting: Anesthesiology

## 2017-03-19 ENCOUNTER — Ambulatory Visit
Admission: RE | Admit: 2017-03-19 | Discharge: 2017-03-19 | Disposition: A | Discharge status: Home / Self Care | Payer: 59 | Source: Ambulatory Visit | Attending: Gastroenterology | Admitting: Gastroenterology

## 2017-03-19 ENCOUNTER — Encounter: Payer: Self-pay | Admitting: *Deleted

## 2017-03-19 DIAGNOSIS — Z87891 Personal history of nicotine dependence: Secondary | ICD-10-CM | POA: Insufficient documentation

## 2017-03-19 DIAGNOSIS — I4891 Unspecified atrial fibrillation: Secondary | ICD-10-CM | POA: Insufficient documentation

## 2017-03-19 DIAGNOSIS — Z1211 Encounter for screening for malignant neoplasm of colon: Secondary | ICD-10-CM | POA: Diagnosis not present

## 2017-03-19 DIAGNOSIS — Z6832 Body mass index (BMI) 32.0-32.9, adult: Secondary | ICD-10-CM | POA: Diagnosis not present

## 2017-03-19 DIAGNOSIS — I10 Essential (primary) hypertension: Secondary | ICD-10-CM | POA: Diagnosis not present

## 2017-03-19 DIAGNOSIS — Z7982 Long term (current) use of aspirin: Secondary | ICD-10-CM | POA: Insufficient documentation

## 2017-03-19 DIAGNOSIS — Z79899 Other long term (current) drug therapy: Secondary | ICD-10-CM | POA: Diagnosis not present

## 2017-03-19 DIAGNOSIS — M1990 Unspecified osteoarthritis, unspecified site: Secondary | ICD-10-CM | POA: Insufficient documentation

## 2017-03-19 DIAGNOSIS — E785 Hyperlipidemia, unspecified: Secondary | ICD-10-CM | POA: Diagnosis not present

## 2017-03-19 DIAGNOSIS — I471 Supraventricular tachycardia: Secondary | ICD-10-CM | POA: Diagnosis not present

## 2017-03-19 DIAGNOSIS — E669 Obesity, unspecified: Secondary | ICD-10-CM | POA: Insufficient documentation

## 2017-03-19 DIAGNOSIS — H8110 Benign paroxysmal vertigo, unspecified ear: Secondary | ICD-10-CM | POA: Insufficient documentation

## 2017-03-19 DIAGNOSIS — G2581 Restless legs syndrome: Secondary | ICD-10-CM | POA: Insufficient documentation

## 2017-03-19 DIAGNOSIS — J45909 Unspecified asthma, uncomplicated: Secondary | ICD-10-CM | POA: Insufficient documentation

## 2017-03-19 DIAGNOSIS — E559 Vitamin D deficiency, unspecified: Secondary | ICD-10-CM | POA: Diagnosis not present

## 2017-03-19 DIAGNOSIS — I472 Ventricular tachycardia: Secondary | ICD-10-CM | POA: Insufficient documentation

## 2017-03-19 HISTORY — DX: Supraventricular tachycardia: I47.1

## 2017-03-19 HISTORY — DX: Unspecified atrial fibrillation: I48.91

## 2017-03-19 HISTORY — DX: Benign paroxysmal vertigo, unspecified ear: H81.10

## 2017-03-19 HISTORY — DX: Liver disease, unspecified: K76.9

## 2017-03-19 HISTORY — DX: Hyperlipidemia, unspecified: E78.5

## 2017-03-19 HISTORY — DX: Essential (primary) hypertension: I10

## 2017-03-19 HISTORY — DX: Palpitations: R00.2

## 2017-03-19 HISTORY — DX: Supraventricular tachycardia, unspecified: I47.10

## 2017-03-19 HISTORY — PX: COLONOSCOPY WITH PROPOFOL: SHX5780

## 2017-03-19 SURGERY — COLONOSCOPY WITH PROPOFOL
Anesthesia: General

## 2017-03-19 MED ORDER — PROPOFOL 500 MG/50ML IV EMUL
INTRAVENOUS | Status: AC
  Filled 2017-03-19: qty 50

## 2017-03-19 MED ORDER — PROPOFOL 500 MG/50ML IV EMUL
INTRAVENOUS | Status: DC | PRN
  Administered 2017-03-19: 120 ug/kg/min via INTRAVENOUS

## 2017-03-19 MED ORDER — LIDOCAINE 2% (20 MG/ML) 5 ML SYRINGE
INTRAMUSCULAR | Status: DC | PRN
  Administered 2017-03-19: 25 mg via INTRAVENOUS

## 2017-03-19 MED ORDER — LIDOCAINE HCL (PF) 2 % IJ SOLN
INTRAMUSCULAR | Status: AC
  Filled 2017-03-19: qty 2

## 2017-03-19 MED ORDER — SODIUM CHLORIDE 0.9 % IV SOLN
INTRAVENOUS | Status: DC
  Administered 2017-03-19: 1000 mL via INTRAVENOUS

## 2017-03-19 MED ORDER — MIDAZOLAM HCL 2 MG/2ML IJ SOLN
INTRAMUSCULAR | Status: AC
  Filled 2017-03-19: qty 2

## 2017-03-19 MED ORDER — SODIUM CHLORIDE 0.9 % IV SOLN: INTRAVENOUS | Status: DC

## 2017-03-19 MED ORDER — PROPOFOL 10 MG/ML IV BOLUS
INTRAVENOUS | Status: DC | PRN
  Administered 2017-03-19 (×2): 50 mg via INTRAVENOUS

## 2017-03-19 MED ORDER — MIDAZOLAM HCL 5 MG/5ML IJ SOLN
INTRAMUSCULAR | Status: DC | PRN
  Administered 2017-03-19: 2 mg via INTRAVENOUS

## 2017-03-19 NOTE — Anesthesia Post-op Follow-up Note (Cosign Needed)
Anesthesia QCDR form completed.        

## 2017-03-19 NOTE — Anesthesia Preprocedure Evaluation (Addendum)
Anesthesia Evaluation  Patient identified by MRN, date of birth, ID band Patient awake    Reviewed: Allergy & Precautions, NPO status , Patient's Chart, lab work & pertinent test results  History of Anesthesia Complications Negative for: history of anesthetic complications  Airway Mallampati: II  TM Distance: >3 FB Neck ROM: Full    Dental  (+) Implants   Pulmonary asthma , former smoker,    breath sounds clear to auscultation- rhonchi (-) wheezing      Cardiovascular hypertension, Pt. on medications (-) CAD, (-) Past MI and (-) Cardiac Stents + dysrhythmias Atrial Fibrillation and Supra Ventricular Tachycardia  Rhythm:Regular Rate:Normal - Systolic murmurs and - Diastolic murmurs    Neuro/Psych negative neurological ROS  negative psych ROS   GI/Hepatic negative GI ROS, Neg liver ROS,   Endo/Other  negative endocrine ROSneg diabetes  Renal/GU negative Renal ROS     Musculoskeletal  (+) Arthritis ,   Abdominal (+) + obese,   Peds  Hematology negative hematology ROS (+)   Anesthesia Other Findings Past Medical History: No date: A-fib (HCC) No date: AF (atrial fibrillation) (HCC) No date: Allergic rhinitis No date: Arthritis No date: Asthma No date: BPV (benign positional vertigo) No date: Bronchitis No date: Clotting disorder (HCC) No date: Dyspepsia No date: Dyspepsia No date: Heart palpitations No date: Hyperlipidemia No date: Hypertension No date: Liver disease No date: Osteoarthrosis No date: Palpitation No date: PSVT (paroxysmal supraventricular tachycardia)* No date: Restless leg syndrome No date: V tach (HCC) No date: Vitamin D deficiency No date: Vitamin D deficiency   Reproductive/Obstetrics                             Anesthesia Physical Anesthesia Plan  ASA: III  Anesthesia Plan: General   Post-op Pain Management:    Induction: Intravenous  PONV Risk Score  and Plan: 2 and Propofol  Airway Management Planned: Natural Airway  Additional Equipment:   Intra-op Plan:   Post-operative Plan:   Informed Consent: I have reviewed the patients History and Physical, chart, labs and discussed the procedure including the risks, benefits and alternatives for the proposed anesthesia with the patient or authorized representative who has indicated his/her understanding and acceptance.   Dental advisory given  Plan Discussed with: CRNA and Anesthesiologist  Anesthesia Plan Comments:        Anesthesia Quick Evaluation

## 2017-03-19 NOTE — Transfer of Care (Signed)
Immediate Anesthesia Transfer of Care Note  Patient: Diane Howe  Procedure(s) Performed: Procedure(s): COLONOSCOPY WITH PROPOFOL (N/A)  Patient Location: Endoscopy Unit  Anesthesia Type:General  Level of Consciousness: awake and alert   Airway & Oxygen Therapy: Patient Spontanous Breathing and Patient connected to nasal cannula oxygen  Post-op Assessment: Report given to RN and Post -op Vital signs reviewed and stable  Post vital signs: Reviewed  Last Vitals:  Vitals:   03/19/17 1337 03/19/17 1338  BP: (!) 101/55 (!) 101/55  Pulse: 71 74  Resp: 16 14  Temp: 36.4 C 36.4 C    Last Pain:  Vitals:   03/19/17 1337  TempSrc: Tympanic      Patients Stated Pain Goal: 0 (03/19/17 1231)  Complications: No apparent anesthesia complications

## 2017-03-19 NOTE — Op Note (Signed)
[facility] Gastroenterology Patient Name: Diane Howe Procedure Date: 03/19/2017 9:45 AM MRN: [id number] Account #: [id number] Date of Birth: 1955/12/16 Admit Type: Outpatient Age: 61 Room: [facility] ENDO ROOM 1 Gender: Female Note Status: Finalized Procedure:            Colonoscopy Indications:          Screening for colorectal malignant neoplasm Providers:            Christena Deem, MD Referring MD:         Daniel Nones, MD (Referring MD) Medicines:            Monitored Anesthesia Care Complications:        No immediate complications. Procedure:            Pre-Anesthesia Assessment:                       - ASA Grade Assessment: III - A patient with severe                        systemic disease.                       After obtaining informed consent, the colonoscope was                        passed under direct vision. Throughout the procedure,                        the patient's blood pressure, pulse, and oxygen                        saturations were monitored continuously. The                        Colonoscope was introduced through the anus and                        advanced to the the cecum, identified by appendiceal                        orifice and ileocecal valve. The colonoscopy was                        performed with moderate difficulty. Successful                        completion of the procedure was aided by changing the                        patient to a supine position and changing the patient                        to a prone position. The patient tolerated the                        procedure well. The quality of the bowel preparation                        was good. Findings:      The colon (entire examined portion) appeared normal.      The  retroflexed view of the distal rectum and anal verge was normal and       showed no anal or rectal abnormalities.      The digital rectal exam was normal.      well healed scar noted ot the  superior gluteal fold. Impression:           - The entire examined colon is normal.                       - The distal rectum and anal verge are normal on                        retroflexion view.                       - No specimens collected. Recommendation:       - Discharge patient to home.                       - Repeat colonoscopy in 10 years for screening purposes. Procedure Code(s):    --- Professional ---                       [id number], Colonoscopy, flexible; diagnostic, including                        collection of specimen(s) by brushing or washing, when                        performed (separate procedure) Diagnosis Code(s):    --- Professional ---                       Z12.11, Encounter for screening for malignant neoplasm                        of colon CPT copyright 2016 American Medical Association. All rights reserved. The codes documented in this report are preliminary and upon coder review may  be revised to meet current compliance requirements. Christena DeemMartin U Rajohn Henery, MD 03/19/2017 1:37:53 PM This report has been signed electronically. Number of Addenda: 0 Note Initiated On: 03/19/2017 9:45 AM Scope Withdrawal Time: 0 hours 10 minutes 7 seconds  Total Procedure Duration: 0 hours 23 minutes 54 seconds       [facility]

## 2017-03-19 NOTE — H&P (Signed)
Outpatient short stay form Pre-procedure 03/19/2017 12:57 PM Christena Deem MD  Primary Physician: Dr. Daniel Nones  Reason for visit:  Colonoscopy  History of present illness:  Patient is a 61 year old female presenting today for a colon cancer screening. She tolerated her prep well. She does take a daily aspirin but has not taken that for about 5 days. She had a colonoscopy about 10 years ago that was negative.    Current Facility-Administered Medications:  .  0.9 %  sodium chloride infusion, , Intravenous, Continuous, Christena Deem, MD, Last Rate: 20 mL/hr at 03/19/17 1241, 1,000 mL at 03/19/17 1241 .  0.9 %  sodium chloride infusion, , Intravenous, Continuous, Christena Deem, MD  Prescriptions Prior to Admission  Medication Sig Dispense Refill Last Dose  . albuterol (PROVENTIL HFA;VENTOLIN HFA) 108 (90 Base) MCG/ACT inhaler Inhale 2 puffs into the lungs every 6 (six) hours as needed for wheezing or shortness of breath.    Past Month at Unknown time  . ascorbic acid (VITAMIN C) 1000 MG tablet Take 1,000 mg by mouth daily.   Past Week at Unknown time  . aspirin EC 81 MG tablet Take 1 tablet (81 mg total) by mouth daily.   Past Week at Unknown time  . busPIRone (BUSPAR) 15 MG tablet Take 7.5 mg by mouth 2 (two) times daily as needed (for anxiety).    Past Week at Unknown time  . Calcium-Magnesium-Vitamin D 400-166.7-133.3 MG-MG-UNIT TABS Take 1 tablet by mouth daily with breakfast.    Past Week at Unknown time  . cholecalciferol (VITAMIN D) 1000 units tablet Take 4,000 Units by mouth daily.   Past Week at Unknown time  . etodolac (LODINE) 500 MG tablet Take 500 mg by mouth 2 (two) times daily.    Past Week at Unknown time  . fexofenadine (ALLEGRA) 180 MG tablet Take 180 mg by mouth daily.   Past Week at Unknown time  . meclizine (ANTIVERT) 25 MG tablet Take 1 tablet (25 mg total) by mouth 3 (three) times daily as needed for dizziness or nausea. 15 tablet 0 Past Week at Unknown  time  . Multiple Vitamins-Minerals (ONE-A-DAY WOMENS 50 PLUS PO) Take 1 tablet by mouth daily with breakfast.   Past Week at Unknown time  . PEPPERMINT OIL PO Take 1 capsule by mouth every morning.   Past Week at Unknown time  . Probiotic CAPS Take 1 capsule by mouth daily with breakfast.   Past Week at Unknown time  . verapamil (CALAN-SR) 120 MG CR tablet Take one tablet (120 mg) by mouth once daily 90 tablet 3 Past Week at Unknown time  . vitamin E 200 UNIT capsule Take 200 Units by mouth daily.   Past Week at Unknown time     Allergies  Allergen Reactions  . Apixaban     Other reaction(s): Other (See Comments) Leg tingling  . Omeprazole Diarrhea  . Diphenhydramine Palpitations  . Tape Rash    Can only wear tape and BandAids short-term, as they make her skin break out     Past Medical History:  Diagnosis Date  . A-fib (HCC)   . AF (atrial fibrillation) (HCC)   . Allergic rhinitis   . Arthritis   . Asthma   . BPV (benign positional vertigo)   . Bronchitis   . Clotting disorder (HCC)   . Dyspepsia   . Dyspepsia   . Heart palpitations   . Hyperlipidemia   . Hypertension   . Liver  disease   . Osteoarthrosis   . Palpitation   . PSVT (paroxysmal supraventricular tachycardia) (HCC)   . Restless leg syndrome   . V tach (HCC)   . Vitamin D deficiency   . Vitamin D deficiency     Review of systems:      Physical Exam    Heart and lungs: Regular rate and rhythm without rub or gallop, lungs are bilaterally clear.    HEENT: Normocephalic atraumatic eyes are anicteric    Other:     Pertinant exam for procedure: Soft nontender nondistended bowel sounds positive normoactive.    Planned proceedures: Colonoscopy and indicated procedures. I have discussed the risks benefits and complications of procedures to include not limited to bleeding, infection, perforation and the risk of sedation and the patient wishes to proceed.    Christena DeemMartin U Achilles Neville,  MD Gastroenterology 03/19/2017  12:57 PM

## 2017-03-21 NOTE — Anesthesia Postprocedure Evaluation (Signed)
Anesthesia Post Note  Patient: Etheleen SiaKathleen M Burnside  Procedure(s) Performed: Procedure(s) (LRB): COLONOSCOPY WITH PROPOFOL (N/A)  Patient location during evaluation: Endoscopy Anesthesia Type: General Level of consciousness: awake and alert and oriented Pain management: pain level controlled Vital Signs Assessment: post-procedure vital signs reviewed and stable Respiratory status: spontaneous breathing, nonlabored ventilation and respiratory function stable Cardiovascular status: blood pressure returned to baseline and stable Postop Assessment: no signs of nausea or vomiting Anesthetic complications: no     Last Vitals:  Vitals:   03/19/17 1337 03/19/17 1357  BP: (!) 101/55 121/63  Pulse: 71   Resp: 16   Temp: 36.4 C     Last Pain:  Vitals:   03/19/17 1337  TempSrc: Tympanic                 Nobie Alleyne

## 2017-03-22 ENCOUNTER — Encounter: Payer: Self-pay | Admitting: Gastroenterology

## 2017-06-01 ENCOUNTER — Ambulatory Visit: Payer: 59 | Admitting: Internal Medicine

## 2017-06-03 ENCOUNTER — Encounter: Payer: Self-pay | Admitting: Internal Medicine

## 2017-06-03 ENCOUNTER — Ambulatory Visit (INDEPENDENT_AMBULATORY_CARE_PROVIDER_SITE_OTHER): Payer: 59 | Admitting: Internal Medicine

## 2017-06-03 VITALS — BP 162/82 | HR 68 | Ht 64.0 in | Wt 193.0 lb

## 2017-06-03 DIAGNOSIS — I481 Persistent atrial fibrillation: Secondary | ICD-10-CM | POA: Diagnosis not present

## 2017-06-03 DIAGNOSIS — I471 Supraventricular tachycardia: Secondary | ICD-10-CM

## 2017-06-03 DIAGNOSIS — I440 Atrioventricular block, first degree: Secondary | ICD-10-CM | POA: Diagnosis not present

## 2017-06-03 DIAGNOSIS — I4819 Other persistent atrial fibrillation: Secondary | ICD-10-CM

## 2017-06-03 MED ORDER — VERAPAMIL HCL ER 120 MG PO TBCR: EXTENDED_RELEASE_TABLET | ORAL | 3 refills | Status: DC

## 2017-06-03 NOTE — Patient Instructions (Signed)

## 2017-06-03 NOTE — Progress Notes (Signed)
Patient Care Team: Lynnea FerrierKlein, Bert J III, MD as PCP - General (Internal Medicine)   HPI  Diane Howe is a 61 y.o. female Seen in follow-up for recurrent atrial arrhythmias.  She had been seen 6/17. 2 distinct tachycardias and been noted 2010 including Valsalva terminated SVT and atrial fibrillation. She had undergone EP testing 2003 where "7 nodes" identified and ablated. There was apparently Hisian FOCUS which was not ablated.  She had been on verapamil. Holter monitor had demonstrated frequent atrial ectopy as well as a short RP tachycardia, this occurring in the context of known first-degree AV block (PR interval 340) She was referred back to [facility]  >> Report --- Multiple EAT's in [place], and primary and secondary AF including ERAF after DCCV despite ibutilide. No ablation done Verapamil was discontinued and atenolol was initiated as well as flecainide. She was started on apixoban  She did not tolerate this well and after follow-up evaluation , and redetermine of her thromboembolic risk it was discontinued.   Her thromboembolic risk profile is notable for gender for a CHADS-VASc score of 0 (1).  She has not tolerated the flecainide very well. She did not tolerate the atenolol at all. That has been discontinued.  She is on verapamil and has had no interval afib  No chest pain or shortness of breath; does not exercise intentionally but is quite active with her 6 terriers   Past Medical History:  Diagnosis Date  . A-fib (HCC)   . AF (atrial fibrillation) (HCC)   . Allergic rhinitis   . Arthritis   . Asthma   . BPV (benign positional vertigo)   . Bronchitis   . Clotting disorder (HCC)   . Dyspepsia   . Dyspepsia   . Heart palpitations   . Hyperlipidemia   . Hypertension   . Liver disease   . Osteoarthrosis   . Palpitation   . PSVT (paroxysmal supraventricular tachycardia) (HCC)   . Restless leg syndrome   . V tach (HCC)   . Vitamin D deficiency   . Vitamin D  deficiency     Past Surgical History:  Procedure Laterality Date  . ABLATION    . CESAREAN SECTION    . COLONOSCOPY    . COLONOSCOPY WITH PROPOFOL N/A 03/19/2017   Procedure: COLONOSCOPY WITH PROPOFOL;  Surgeon: Christena DeemSkulskie, Martin U, MD;  [facility];  Service: Endoscopy;  Laterality: N/A;  . KNEE SURGERY    . TONSILLECTOMY      Current Outpatient Prescriptions  Medication Sig Dispense Refill  . albuterol (PROVENTIL HFA;VENTOLIN HFA) 108 (90 Base) MCG/ACT inhaler Inhale 2 puffs into the lungs every 6 (six) hours as needed for wheezing or shortness of breath.     Marland Kitchen. ascorbic acid (VITAMIN C) 1000 MG tablet Take 1,000 mg by mouth daily.    Marland Kitchen. aspirin EC 81 MG tablet Take 1 tablet (81 mg total) by mouth daily.    . busPIRone (BUSPAR) 15 MG tablet Take 7.5 mg by mouth 2 (two) times daily as needed (for anxiety).     . Calcium-Magnesium-Vitamin D 400-166.7-133.3 MG-MG-UNIT TABS Take 1 tablet by mouth daily with breakfast.     . cholecalciferol (VITAMIN D) 1000 units tablet Take 4,000 Units by mouth daily.    Marland Kitchen. etodolac (LODINE) 500 MG tablet Take 500 mg by mouth 2 (two) times daily.     . fexofenadine (ALLEGRA) 180 MG tablet Take 180 mg by mouth daily.    . meclizine (  ANTIVERT) 25 MG tablet Take 1 tablet (25 mg total) by mouth 3 (three) times daily as needed for dizziness or nausea. 15 tablet 0  . Multiple Vitamins-Minerals (ONE-A-DAY WOMENS 50 PLUS PO) Take 1 tablet by mouth daily with breakfast.    . PEPPERMINT OIL PO Take 1 capsule by mouth every morning.    . Probiotic CAPS Take 1 capsule by mouth daily with breakfast.    . verapamil (CALAN-SR) 120 MG CR tablet Take one tablet (120 mg) by mouth once daily 90 tablet 3  . vitamin E 200 UNIT capsule Take 200 Units by mouth daily.     No current facility-administered medications for this visit.     Allergies  Allergen Reactions  . Apixaban     Other reaction(s): Other (See Comments) Leg tingling  . Omeprazole Diarrhea  .  Diphenhydramine Palpitations  . Tape Rash    Can only wear tape and BandAids short-term, as they make her skin break out      Review of Systems negative except from HPI and PMH  Physical Exam BP 136/72 (BP Location: Left Arm, Patient Position: Sitting, Cuff Size: Normal)   Pulse 68   Ht  (1.626 m)   Wt 193 lb (87.5 kg)   LMP 01/22/2016   BMI 33.13 kg/m  Well developed and nourished in no acute distress HENT normal Neck supple with JVP-flat Carotids brisk and full without bruits Clear Regular rate and rhythm, no murmurs or gallops Abd-soft with active BS without hepatomegaly No Clubbing cyanosis edema Skin-warm and dry A & Oriented  Grossly normal sensory and motor function   ECG demonstrates sinus @ 68 24/10/40  Assessment and  Plan Atrial fibrillation-persistent  SVT-long RP  Hypertension  Sleep disordered breathing  First-degree AV block (236--9/18)    No interval tachypalpitations  BP on repeat was 162 She will follow this up with her PCP She is on NSAIDs and this may be contributing I will reach out to B Graciela Husbands again re stopping ASA      Current medicines are reviewed at length with the patient today .  The patient does  have concerns regarding medicines As above .

## 2017-06-28 DIAGNOSIS — Z23 Encounter for immunization: Secondary | ICD-10-CM | POA: Diagnosis not present

## 2017-08-26 DIAGNOSIS — J209 Acute bronchitis, unspecified: Secondary | ICD-10-CM | POA: Diagnosis not present

## 2017-09-03 DIAGNOSIS — I480 Paroxysmal atrial fibrillation: Secondary | ICD-10-CM | POA: Diagnosis not present

## 2017-09-03 DIAGNOSIS — I471 Supraventricular tachycardia: Secondary | ICD-10-CM | POA: Diagnosis not present

## 2017-09-15 DIAGNOSIS — I480 Paroxysmal atrial fibrillation: Secondary | ICD-10-CM | POA: Diagnosis not present

## 2017-09-15 DIAGNOSIS — I471 Supraventricular tachycardia: Secondary | ICD-10-CM | POA: Diagnosis not present

## 2017-09-15 DIAGNOSIS — R05 Cough: Secondary | ICD-10-CM | POA: Diagnosis not present

## 2017-09-15 DIAGNOSIS — Z0000 Encounter for general adult medical examination without abnormal findings: Secondary | ICD-10-CM | POA: Diagnosis not present

## 2017-10-08 DIAGNOSIS — R0602 Shortness of breath: Secondary | ICD-10-CM | POA: Diagnosis not present

## 2017-10-20 DIAGNOSIS — I471 Supraventricular tachycardia: Secondary | ICD-10-CM | POA: Diagnosis not present

## 2017-10-20 DIAGNOSIS — E7849 Other hyperlipidemia: Secondary | ICD-10-CM | POA: Diagnosis not present

## 2017-10-20 DIAGNOSIS — R942 Abnormal results of pulmonary function studies: Secondary | ICD-10-CM | POA: Diagnosis not present

## 2017-12-02 ENCOUNTER — Ambulatory Visit: Payer: 59 | Admitting: Internal Medicine

## 2017-12-02 ENCOUNTER — Encounter: Payer: Self-pay | Admitting: Internal Medicine

## 2017-12-02 VITALS — BP 136/60 | HR 76 | Ht 64.0 in | Wt 197.5 lb

## 2017-12-02 DIAGNOSIS — I440 Atrioventricular block, first degree: Secondary | ICD-10-CM | POA: Diagnosis not present

## 2017-12-02 DIAGNOSIS — I10 Essential (primary) hypertension: Secondary | ICD-10-CM

## 2017-12-02 DIAGNOSIS — I4819 Other persistent atrial fibrillation: Secondary | ICD-10-CM

## 2017-12-02 DIAGNOSIS — I471 Supraventricular tachycardia: Secondary | ICD-10-CM

## 2017-12-02 DIAGNOSIS — I481 Persistent atrial fibrillation: Secondary | ICD-10-CM | POA: Diagnosis not present

## 2017-12-02 MED ORDER — VERAPAMIL HCL ER 120 MG PO TBCR: EXTENDED_RELEASE_TABLET | ORAL | 3 refills | Status: DC

## 2017-12-02 NOTE — Patient Instructions (Signed)

## 2017-12-02 NOTE — Progress Notes (Signed)
Patient Care Team: Lynnea FerrierKlein, Bert J III, MD as PCP - General (Internal Medicine)   HPI  Diane Howe is a 62 y.o. female Seen in follow-up for recurrent atrial arrhythmias.  She had been seen 6/17. 2 distinct tachycardias and been noted 2010 including Valsalva terminated SVT and atrial fibrillation. She had undergone EP testing 2003 where "7 nodes" identified and ablated. There was apparently Hisian FOCUS which was not ablated.  She had been on verapamil. Holter monitor had demonstrated frequent atrial ectopy as well as a short RP tachycardia, this occurring in the context of known first-degree AV block (PR interval 340) She was referred back to [facility]  >> Report --- Multiple EAT's in [place], and primary and secondary AF including ERAF after DCCV despite ibutilide. No ablation done Verapamil was discontinued and atenolol was initiated as well as flecainide. She was started on apixoban  She did not tolerate this well and after follow-up evaluation , and redetermine of her thromboembolic risk it was discontinued.   Her thromboembolic risk profile is notable for gender for a CHADS-VASc score of 0 (1).     She is on verapamil *and her heart issues are largely quiet at this time.  She denies palpitations.  She is having no chest pain or shortness of breath.*     Past Medical History:  Diagnosis Date  . A-fib (HCC)   . AF (atrial fibrillation) (HCC)   . Allergic rhinitis   . Arthritis   . Asthma   . BPV (benign positional vertigo)   . Bronchitis   . Clotting disorder (HCC)   . Dyspepsia   . Dyspepsia   . Heart palpitations   . Hyperlipidemia   . Hypertension   . Liver disease   . Osteoarthrosis   . Palpitation   . PSVT (paroxysmal supraventricular tachycardia) (HCC)   . Restless leg syndrome   . V tach (HCC)   . Vitamin D deficiency   . Vitamin D deficiency     Past Surgical History:  Procedure Laterality Date  . ABLATION    . CESAREAN SECTION    . COLONOSCOPY    .  COLONOSCOPY WITH PROPOFOL N/A 03/19/2017   Procedure: COLONOSCOPY WITH PROPOFOL;  Surgeon: Christena DeemSkulskie, Martin U, MD;  [facility];  Service: Endoscopy;  Laterality: N/A;  . KNEE SURGERY    . TONSILLECTOMY      Current Outpatient Medications  Medication Sig Dispense Refill  . albuterol (PROVENTIL HFA;VENTOLIN HFA) 108 (90 Base) MCG/ACT inhaler Inhale 2 puffs into the lungs every 6 (six) hours as needed for wheezing or shortness of breath.     Marland Kitchen. ascorbic acid (VITAMIN C) 1000 MG tablet Take 1,000 mg by mouth daily.    Marland Kitchen. aspirin EC 81 MG tablet Take 1 tablet (81 mg total) by mouth daily.    . busPIRone (BUSPAR) 15 MG tablet Take 7.5 mg by mouth 2 (two) times daily as needed (for anxiety).     . Calcium-Magnesium-Vitamin D 400-166.7-133.3 MG-MG-UNIT TABS Take 1 tablet by mouth daily with breakfast.     . cholecalciferol (VITAMIN D) 1000 units tablet Take 4,000 Units by mouth daily.    Marland Kitchen. etodolac (LODINE) 500 MG tablet Take 500 mg by mouth 2 (two) times daily.     . fexofenadine (ALLEGRA) 180 MG tablet Take 180 mg by mouth daily.    . meclizine (ANTIVERT) 25 MG tablet Take 1 tablet (25 mg total) by mouth 3 (three) times daily as  needed for dizziness or nausea. 15 tablet 0  . Multiple Vitamins-Minerals (ONE-A-DAY WOMENS 50 PLUS PO) Take 1 tablet by mouth daily with breakfast.    . PEPPERMINT OIL PO Take 1 capsule by mouth every morning.    . Probiotic CAPS Take 1 capsule by mouth daily with breakfast.    . SYMBICORT 80-4.5 MCG/ACT inhaler INHALE 2 INHALATION INTO THE LUNGS 2 (TWO) TIMES DAILY  11  . verapamil (CALAN-SR) 120 MG CR tablet Take one tablet (120 mg) by mouth once daily 90 tablet 3  . vitamin E 200 UNIT capsule Take 200 Units by mouth daily.     No current facility-administered medications for this visit.     Allergies  Allergen Reactions  . Apixaban     Other reaction(s): Other (See Comments) Leg tingling  . Omeprazole Diarrhea  . Diphenhydramine Palpitations  . Tape  Rash    Can only wear tape and BandAids short-term, as they make her skin break out      Review of Systems negative except from HPI and PMH  Physical Exam BP 136/60 (BP Location: Left Arm, Patient Position: Sitting, Cuff Size: Normal)   Pulse 76   Ht 5\' 4"  (1.626 m)   Wt 197 lb 8 oz (89.6 kg)   LMP 01/22/2016   BMI 33.90 kg/m  Well developed and nourished in no acute distress HENT normal Neck supple with JVP-flat Clear Regular rate and rhythm, no murmurs or gallops Abd-soft with active BS No Clubbing cyanosis edema Skin-warm and dry A & Oriented  Grossly normal sensory and motor function  ECG demonstrates sinus 76 21/09/41   Assessment and  Plan Atrial fibrillation-persistent  SVT-long RP  Hypertension  Sleep disordered breathing  First-degree AV block (236--9/18)    BP under control  No palps      Current medicines are reviewed at length with the patient today .  The patient does  have concerns regarding medicines As above .

## 2018-03-04 DIAGNOSIS — E7849 Other hyperlipidemia: Secondary | ICD-10-CM | POA: Diagnosis not present

## 2018-03-04 DIAGNOSIS — R942 Abnormal results of pulmonary function studies: Secondary | ICD-10-CM | POA: Diagnosis not present

## 2018-03-18 DIAGNOSIS — I480 Paroxysmal atrial fibrillation: Secondary | ICD-10-CM | POA: Diagnosis not present

## 2018-03-18 DIAGNOSIS — I471 Supraventricular tachycardia: Secondary | ICD-10-CM | POA: Diagnosis not present

## 2018-03-18 DIAGNOSIS — G5702 Lesion of sciatic nerve, left lower limb: Secondary | ICD-10-CM | POA: Diagnosis not present

## 2018-04-08 DIAGNOSIS — M545 Low back pain: Secondary | ICD-10-CM | POA: Diagnosis not present

## 2018-04-18 DIAGNOSIS — M545 Low back pain: Secondary | ICD-10-CM | POA: Diagnosis not present

## 2018-07-27 DIAGNOSIS — Z23 Encounter for immunization: Secondary | ICD-10-CM | POA: Diagnosis not present

## 2018-09-09 DIAGNOSIS — E7849 Other hyperlipidemia: Secondary | ICD-10-CM | POA: Diagnosis not present

## 2018-09-09 DIAGNOSIS — I480 Paroxysmal atrial fibrillation: Secondary | ICD-10-CM | POA: Diagnosis not present

## 2018-09-09 DIAGNOSIS — I471 Supraventricular tachycardia: Secondary | ICD-10-CM | POA: Diagnosis not present

## 2018-09-16 ENCOUNTER — Other Ambulatory Visit: Payer: Self-pay | Admitting: Internal Medicine

## 2018-09-16 DIAGNOSIS — E7849 Other hyperlipidemia: Secondary | ICD-10-CM | POA: Diagnosis not present

## 2018-09-16 DIAGNOSIS — Z1231 Encounter for screening mammogram for malignant neoplasm of breast: Secondary | ICD-10-CM

## 2018-09-16 DIAGNOSIS — I480 Paroxysmal atrial fibrillation: Secondary | ICD-10-CM | POA: Diagnosis not present

## 2018-09-16 DIAGNOSIS — Z0000 Encounter for general adult medical examination without abnormal findings: Secondary | ICD-10-CM | POA: Diagnosis not present

## 2018-09-26 ENCOUNTER — Ambulatory Visit
Admission: RE | Admit: 2018-09-26 | Discharge: 2018-09-26 | Disposition: A | Discharge status: Home / Self Care | Payer: 59 | Source: Ambulatory Visit | Attending: Internal Medicine | Admitting: Internal Medicine

## 2018-09-26 DIAGNOSIS — Z1231 Encounter for screening mammogram for malignant neoplasm of breast: Secondary | ICD-10-CM | POA: Insufficient documentation

## 2018-12-20 ENCOUNTER — Other Ambulatory Visit: Payer: Self-pay | Admitting: Internal Medicine

## 2019-01-21 IMAGING — MG MM DIGITAL SCREENING BILAT W/ TOMO W/ CAD
8 of 12 series · 8 of 28 positions shown · non-contrast
Comparison: Previous exam(s).

CLINICAL DATA: Screening.

EXAM:
2D DIGITAL SCREENING BILATERAL MAMMOGRAM WITH CAD AND ADJUNCT TOMO

[R MLO synth-2D]
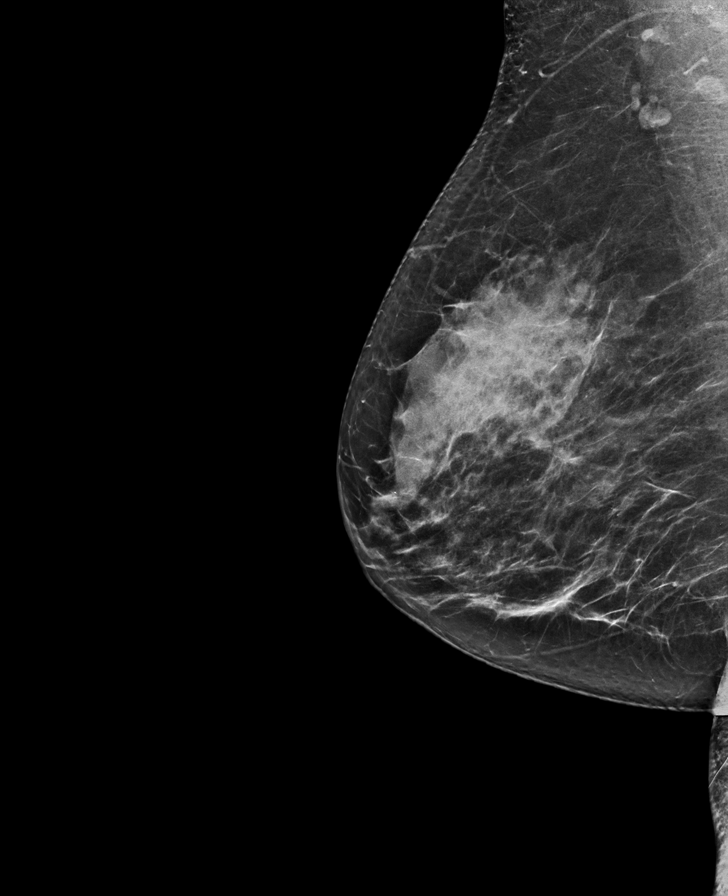

[L MLO synth-2D]
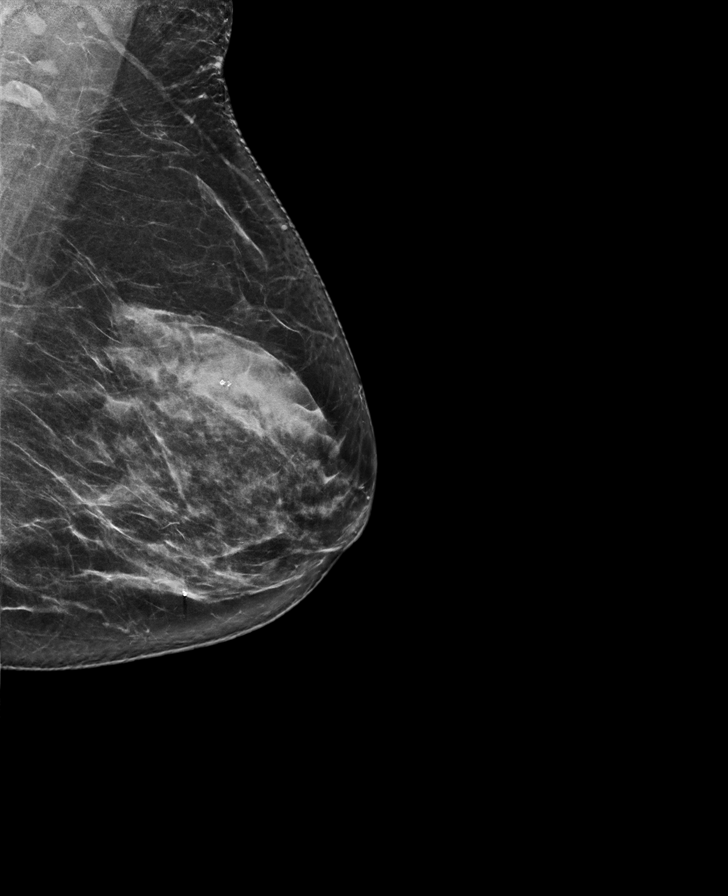

[R MLO]
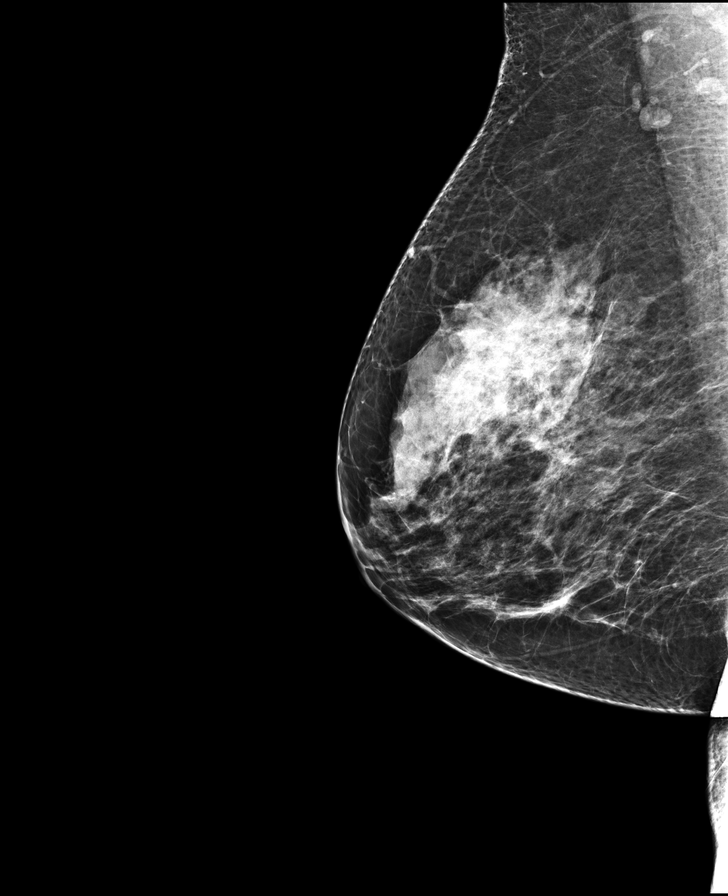

[R CC]
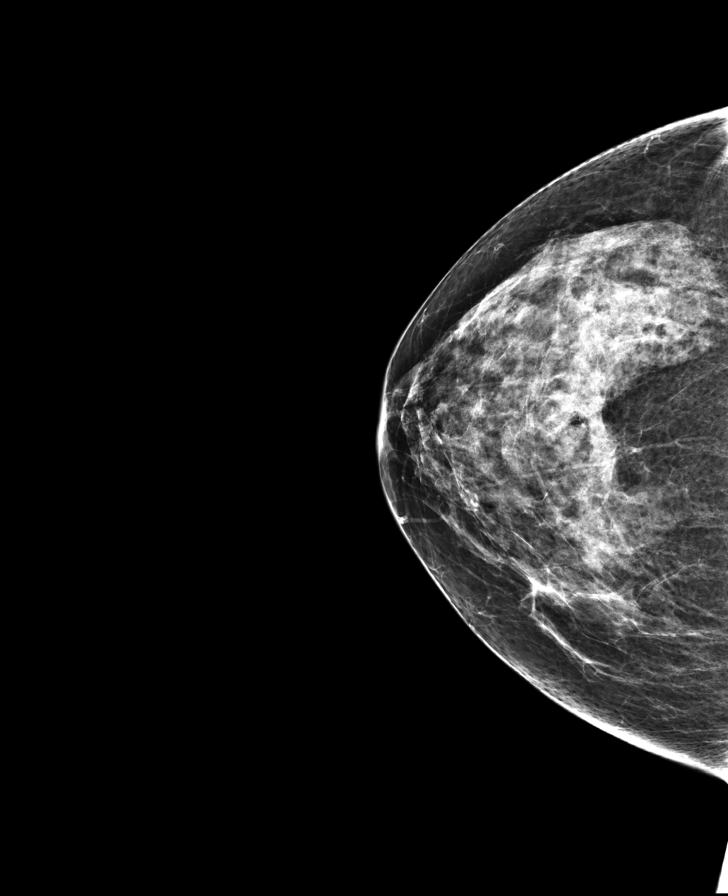

[L MLO]
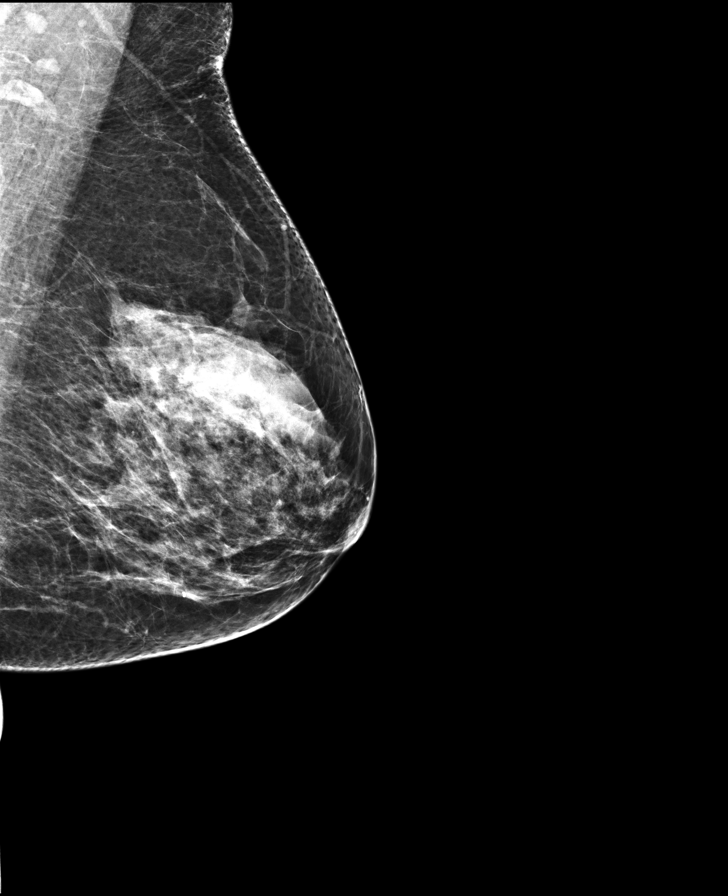

[R CC synth-2D]
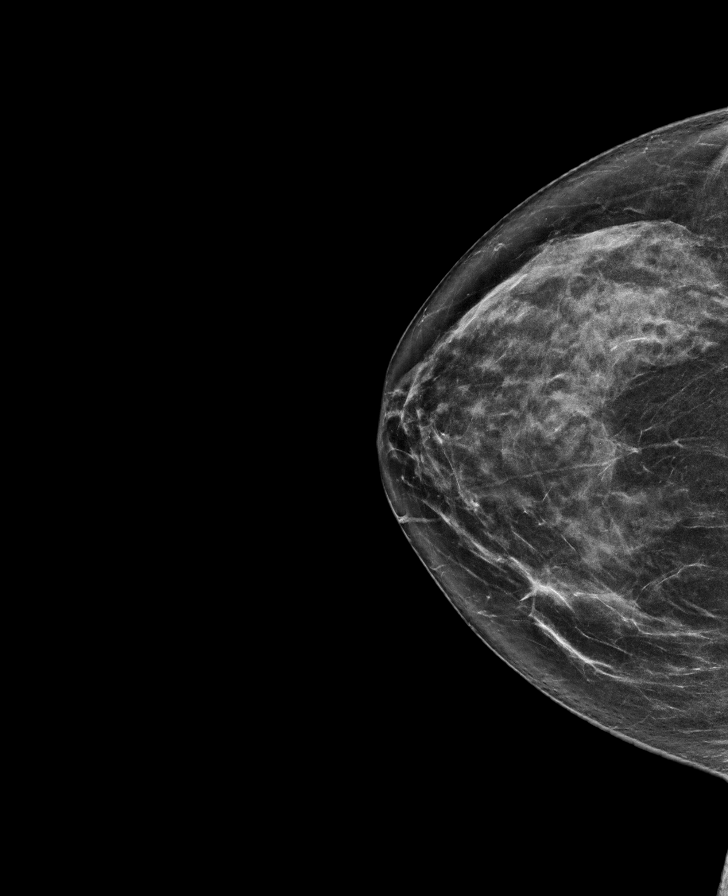

[L CC synth-2D]
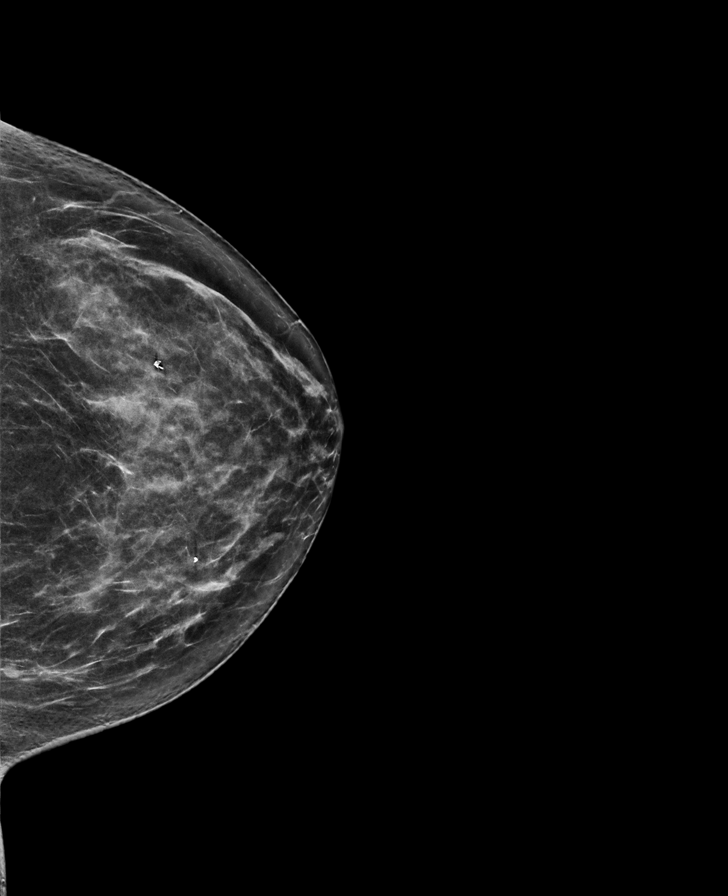

[L CC]
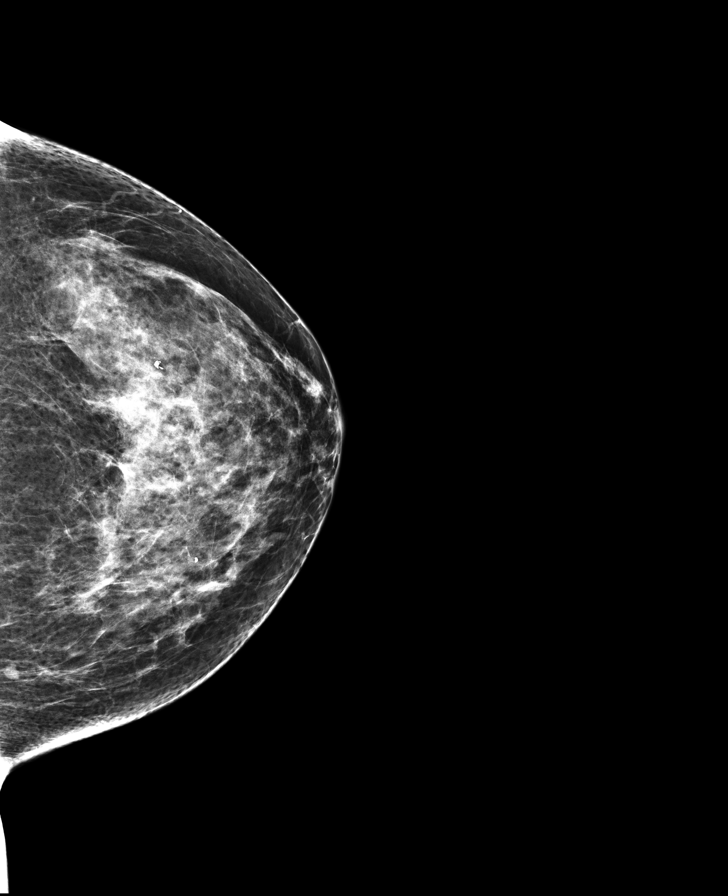

[8 of 28 positions shown; findings below may reference images not displayed]

ACR Breast Density Category c: The breast tissue is heterogeneously
dense, which may obscure small masses.
FINDINGS: There are no findings suspicious for malignancy. Images were
processed with CAD.
IMPRESSION: No mammographic evidence of malignancy. A result letter of this
screening mammogram will be mailed directly to the patient.

RECOMMENDATION:
Screening mammogram in one year. (Code:TN-[id number])

BI-RADS CATEGORY  1: Negative.

## 2019-07-27 ENCOUNTER — Ambulatory Visit (INDEPENDENT_AMBULATORY_CARE_PROVIDER_SITE_OTHER): Payer: 59 | Admitting: Internal Medicine

## 2019-07-27 ENCOUNTER — Other Ambulatory Visit: Payer: Self-pay

## 2019-07-27 ENCOUNTER — Encounter: Payer: Self-pay | Admitting: Internal Medicine

## 2019-07-27 VITALS — BP 126/76 | HR 89 | Ht 64.0 in | Wt 184.5 lb

## 2019-07-27 DIAGNOSIS — I4819 Other persistent atrial fibrillation: Secondary | ICD-10-CM

## 2019-07-27 MED ORDER — PROPAFENONE HCL ER 225 MG PO CP12: 225.0000 mg | ORAL_CAPSULE | Freq: Two times a day (BID) | ORAL | 2 refills | Status: DC

## 2019-07-27 NOTE — Progress Notes (Signed)
Patient Care Team: Lynnea Ferrier, MD as PCP - General (Internal Medicine)   HPI  Diane Howe is a 63 y.o. female Seen in follow-up for recurrent atrial arrhythmias.  She had been seen 6/17. 2 distinct tachycardias and been noted 2010 including Valsalva terminated SVT and atrial fibrillation. She had undergone EP testing 2003 where "7 nodes" identified and ablated. There was apparently Hisian FOCUS which was not ablated.  She had been on verapamil. Holter monitor had demonstrated frequent atrial ectopy as well as a short RP tachycardia, this occurring in the context of known first-degree AV block (PR interval 340) She was referred back to [facility]  >> Report --- Multiple EAT's in [place], and primary and secondary AF including ERAF after DCCV despite ibutilide. No ablation done Verapamil was discontinued and atenolol was initiated as well as flecainide. She was started on apixoban  She did not tolerate this well and after follow-up evaluation , and redetermine of her thromboembolic risk it was discontinued. As was flecainide;  Verapamil somepoint resumed  Antiarrhythmics Date Reason stopped  Flecainide  / Lightheadedness  Propafeone 10/20    More recently has had palpitations both the beginning of Covid no again of late.  She describes in the 2 different ways.  The first is a fluttering associated with lightheadedness which she thinks is her atrial fibrillation.  The other is my heart pounding which is more likely consistent with the regular SVT that was noted on her monitor which were reviewed again from 2017.  The latter is terminated by Valsalva  Thromboembolic risk factors ( age -24, Gender-1) for a CHADSVASc Score of 2  DATE PR interval QRSduration Dose  10/20  152 92 0         \  Past Medical History:  Diagnosis Date  . A-fib (HCC)   . AF (atrial fibrillation) (HCC)   . Allergic rhinitis   . Arthritis   . Asthma   . BPV (benign positional vertigo)   . Bronchitis   .  Clotting disorder (HCC)   . Dyspepsia   . Dyspepsia   . Heart palpitations   . Hyperlipidemia   . Hypertension   . Liver disease   . Osteoarthrosis   . Palpitation   . PSVT (paroxysmal supraventricular tachycardia) (HCC)   . Restless leg syndrome   . V tach (HCC)   . Vitamin D deficiency   . Vitamin D deficiency     Past Surgical History:  Procedure Laterality Date  . ABLATION    . CESAREAN SECTION    . COLONOSCOPY    . COLONOSCOPY WITH PROPOFOL N/A 03/19/2017   Procedure: COLONOSCOPY WITH PROPOFOL;  Surgeon: Christena Deem, MD;  Location: [facility] ENDOSCOPY;  Service: Endoscopy;  Laterality: N/A;  . KNEE SURGERY    . TONSILLECTOMY      Current Outpatient Medications  Medication Sig Dispense Refill  . albuterol (PROVENTIL HFA;VENTOLIN HFA) 108 (90 Base) MCG/ACT inhaler Inhale 2 puffs into the lungs every 6 (six) hours as needed for wheezing or shortness of breath.     Marland Kitchen ascorbic acid (VITAMIN C) 1000 MG tablet Take 1,000 mg by mouth daily.    Marland Kitchen aspirin EC 81 MG tablet Take 1 tablet (81 mg total) by mouth daily.    . Calcium-Magnesium-Vitamin D 400-166.7-133.3 MG-MG-UNIT TABS Take 1 tablet by mouth daily with breakfast.     . cholecalciferol (VITAMIN D) 1000 units tablet Take 4,000 Units by mouth daily.    Marland Kitchen  etodolac (LODINE) 500 MG tablet Take 500 mg by mouth 2 (two) times daily.     . fexofenadine (ALLEGRA) 180 MG tablet Take 180 mg by mouth daily.    . Multiple Vitamins-Minerals (ONE-A-DAY WOMENS 50 PLUS PO) Take 1 tablet by mouth daily with breakfast.    . PEPPERMINT OIL PO Take 1 capsule by mouth every morning.    . Probiotic CAPS Take 1 capsule by mouth daily with breakfast.    . SYMBICORT 80-4.5 MCG/ACT inhaler INHALE 2 INHALATION INTO THE LUNGS 2 (TWO) TIMES DAILY  11  . verapamil (CALAN-SR) 120 MG CR tablet TAKE 1 TABLET BY MOUTH ONCE DAILY 90 tablet 1  . vitamin E 200 UNIT capsule Take 200 Units by mouth daily.     No current facility-administered medications for  this visit.     Allergies  Allergen Reactions  . Apixaban     Other reaction(s): Other (See Comments) Leg tingling  . Omeprazole Diarrhea  . Diphenhydramine Palpitations  . Tape Rash    Can only wear tape and BandAids short-term, as they make her skin break out      Review of Systems negative except from HPI and PMH  Physical Exam BP 126/76 (BP Location: Left Arm, Patient Position: Sitting, Cuff Size: Normal)   Pulse 89   Ht 5\' 4"  (1.626 m)   Wt 184 lb 8 oz (83.7 kg)   LMP 01/22/2016   BMI 31.67 kg/m  Well developed and well nourished in no acute distress HENT normal Neck supple with JVP-flat Clear Device pocket well healed; without hematoma or erythema.  There is no tethering  Regular rate and rhythm, no  murmur Abd-soft with active BS No Clubbing cyanosis   edema Skin-warm and dry A & Oriented  Grossly normal sensory and motor function  ECG sinus @ 89 15/09/38   Assessment and  Plan Atrial fibrillation-persistent  SVT-long RP  Hypertension  Sleep disordered breathing  First-degree AV block (236--9/18)    BP under control  Recurrent palpitations.  Weakness gust the use of propafenone.  We will have to keep close attention to her antegrade conduction with her (variable)  first-degree AV block.  Discussed again the issue of aspirin and atrial fibrillation anticoagulation.  She is averse to anticoagulation because of her chronic bronchitis requiring Zithromax and her use of etodolac for her arthritis.  It is not clear how much atrial fibrillation she is having so I recommended the use of AliveCor monitor  We spent more than 50% of our >25 min visit in face to face counseling regarding the above      Current medicines are reviewed at length with the patient today .  The patient does  have concerns regarding medicines As above .

## 2019-07-27 NOTE — Patient Instructions (Addendum)
Medication Instructions:  - Your physician has recommended you make the following change in your medication:   1 week before your treadmill test you will-  1) Stop verapamil  2) Start propafenone 225 mg- take 1 tablet by mouth twice daily  *If you need a refill on your cardiac medications before your next appointment, please call your pharmacy*  Lab Work: - none ordered  If you have labs (blood work) drawn today and your tests are completely normal, you will receive your results only by: Marland Kitchen MyChart Message (if you have MyChart) OR . A paper copy in the mail If you have any lab test that is abnormal or we need to change your treatment, we will call you to review the results.  Testing/Procedures: - Your physician has requested that you have an exercise tolerance test  (ok to due at 8:00 am on a day Dr. Caryl Comes is in the office)   - you may eat a light breakfast/ lunch prior to your procedure - no caffeine for 24 hours prior to your test (coffee, tea, soft drinks, or chocolate)  - no smoking/ vaping for 4 hours prior to your test - you may take your regular medications the day of your test - bring any inhalers with you to your test - wear comfortable clothing & tennis/ non-skid shoes to walk on the treadmill   Follow-Up: At [facility], you and your health needs are our priority.  As part of our continuing mission to provide you with exceptional heart care, we have created designated Provider Care Teams.  These Care Teams include your primary Cardiologist (physician) and Advanced Practice Providers (APPs -  Physician Assistants and Nurse Practitioners) who all work together to provide you with the care you need, when you need it.  Your next appointment:   pending the results of your treadmill test  The format for your next appointment:   pending  Provider:   pending  Other Instructions - n/a

## 2019-08-16 ENCOUNTER — Other Ambulatory Visit: Payer: Self-pay

## 2019-08-16 ENCOUNTER — Other Ambulatory Visit
Admission: RE | Admit: 2019-08-16 | Discharge: 2019-08-16 | Disposition: A | Discharge status: Home / Self Care | Payer: 59 | Source: Ambulatory Visit | Attending: Internal Medicine | Admitting: Internal Medicine

## 2019-08-16 DIAGNOSIS — Z01812 Encounter for preprocedural laboratory examination: Secondary | ICD-10-CM | POA: Insufficient documentation

## 2019-08-16 DIAGNOSIS — Z20828 Contact with and (suspected) exposure to other viral communicable diseases: Secondary | ICD-10-CM | POA: Insufficient documentation

## 2019-08-16 LAB — SARS CORONAVIRUS 2 (TAT 6-24 HRS): SARS Coronavirus 2: NEGATIVE

## 2019-08-17 ENCOUNTER — Ambulatory Visit (INDEPENDENT_AMBULATORY_CARE_PROVIDER_SITE_OTHER): Payer: 59 | Admitting: Internal Medicine

## 2019-08-17 DIAGNOSIS — I4819 Other persistent atrial fibrillation: Secondary | ICD-10-CM

## 2019-08-17 LAB — EXERCISE TOLERANCE TEST
Estimated workload: 8 METS
Exercise duration (min): 4 min
Exercise duration (sec): 0 s
MPHR: 155 {beats}/min
Peak HR: 134 {beats}/min
Percent HR: 86 %
Rest HR: 87 {beats}/min

## 2019-08-17 MED ORDER — PROPAFENONE HCL ER 225 MG PO CP12: 225.0000 mg | ORAL_CAPSULE | Freq: Two times a day (BID) | ORAL | 3 refills | Status: DC

## 2019-08-17 NOTE — Patient Instructions (Signed)
Medication Instructions:  - Your physician recommends that you continue on your current medications as directed. Please refer to the Current Medication list given to you today.  *If you need a refill on your cardiac medications before your next appointment, please call your pharmacy*  Lab Work: - none ordered  If you have labs (blood work) drawn today and your tests are completely normal, you will receive your results only by: . MyChart Message (if you have MyChart) OR . A paper copy in the mail If you have any lab test that is abnormal or we need to change your treatment, we will call you to review the results.  Testing/Procedures: - none ordered  Follow-Up: At [facility], you and your health needs are our priority.  As part of our continuing mission to provide you with exceptional heart care, we have created designated Provider Care Teams.  These Care Teams include your primary Cardiologist (physician) and Advanced Practice Providers (APPs -  Physician Assistants and Nurse Practitioners) who all work together to provide you with the care you need, when you need it.  Your next appointment:   3 month(s)  The format for your next appointment:   In Person  Provider:   Steven Klein, MD  Other Instructions n/a  

## 2019-11-02 ENCOUNTER — Other Ambulatory Visit: Payer: Self-pay

## 2019-11-02 ENCOUNTER — Ambulatory Visit: Payer: 59 | Admitting: Internal Medicine

## 2019-11-02 ENCOUNTER — Encounter: Payer: Self-pay | Admitting: Internal Medicine

## 2019-11-02 VITALS — BP 110/72 | HR 68 | Ht 64.0 in | Wt 178.5 lb

## 2019-11-02 DIAGNOSIS — I440 Atrioventricular block, first degree: Secondary | ICD-10-CM

## 2019-11-02 DIAGNOSIS — I4819 Other persistent atrial fibrillation: Secondary | ICD-10-CM

## 2019-11-02 DIAGNOSIS — I10 Essential (primary) hypertension: Secondary | ICD-10-CM

## 2019-11-02 DIAGNOSIS — I471 Supraventricular tachycardia: Secondary | ICD-10-CM

## 2019-11-02 MED ORDER — VERAPAMIL HCL ER 120 MG PO TBCR: EXTENDED_RELEASE_TABLET | ORAL | 3 refills | Status: DC

## 2019-11-02 NOTE — Progress Notes (Signed)
Patient Care Team: Lynnea Ferrier, MD as PCP - General (Internal Medicine)   HPI  Diane Howe is a 64 y.o. female Seen in follow-up for recurrent atrial arrhythmias.  She had been seen 6/17. 2 distinct tachycardias and been noted 2010 including Valsalva terminated SVT and atrial fibrillation. She had undergone EP testing 2003 where "7 nodes" identified and ablated. There was apparently Hisian FOCUS which was not ablated.  She had been on verapamil.  Holter monitor had demonstrated frequent atrial ectopy as well as a short RP tachycardia, this occurring in the context of known first-degree AV block (PR interval 340) She was referred back to [facility]  >> Report --- Multiple EAT's in [place], and primary and secondary AF including ERAF after DCCV despite ibutilide. No ablation done  Verapamil was discontinued and atenolol was initiated as well as flecainide. She was started on apixoban  She did not tolerate this well and after follow-up evaluation , and redetermine of her thromboembolic risk it was discontinued. As was flecainide;  Verapamil somepoint resumed  Antiarrhythmics Date Reason stopped  Flecainide  / Lightheadedness  Propafeone 10/20    More recently has had palpitations both the beginning of Covid no again of late.  She describes in the 2 different ways.  The first is a fluttering associated with lightheadedness which she thinks is her atrial fibrillation.  The other is my heart pounding which is more likely consistent with the regular SVT that was noted on her monitor which were reviewed again from 2017.  The latter is terminated by Valsalva Recommended AliveCor Monitor  She did not get it  Has done well on propafenone until last week, multiple different palpitations, all of which she recognized  Took verapamil and things have settled down.  Surprisingly constipation assoc with propafenone has improved considerably  Thromboembolic risk factors ( age -100, Gender-1) for a  CHADSVASc Score of 2  DATE PR interval QRSduration Dose  10/20  252 92 0   2/21 256 106 225   \  Past Medical History:  Diagnosis Date  . A-fib (HCC)   . AF (atrial fibrillation) (HCC)   . Allergic rhinitis   . Arthritis   . Asthma   . BPV (benign positional vertigo)   . Bronchitis   . Clotting disorder (HCC)   . Dyspepsia   . Dyspepsia   . Heart palpitations   . Hyperlipidemia   . Hypertension   . Liver disease   . Osteoarthrosis   . Palpitation   . PSVT (paroxysmal supraventricular tachycardia) (HCC)   . Restless leg syndrome   . V tach (HCC)   . Vitamin D deficiency   . Vitamin D deficiency     Past Surgical History:  Procedure Laterality Date  . ABLATION    . CESAREAN SECTION    . COLONOSCOPY    . COLONOSCOPY WITH PROPOFOL N/A 03/19/2017   Procedure: COLONOSCOPY WITH PROPOFOL;  Surgeon: Christena Deem, MD;  Location: [facility] ENDOSCOPY;  Service: Endoscopy;  Laterality: N/A;  . KNEE SURGERY    . TONSILLECTOMY      Current Outpatient Medications  Medication Sig Dispense Refill  . albuterol (PROVENTIL HFA;VENTOLIN HFA) 108 (90 Base) MCG/ACT inhaler Inhale 2 puffs into the lungs every 6 (six) hours as needed for wheezing or shortness of breath.     Marland Kitchen ascorbic acid (VITAMIN C) 1000 MG tablet Take 1,000 mg by mouth daily.    Marland Kitchen aspirin EC 81 MG  tablet Take 1 tablet (81 mg total) by mouth daily.    Marland Kitchen BIOTIN PO Take by mouth daily. Taking 1 tablet daily    . Calcium-Magnesium-Vitamin D 400-166.7-133.3 MG-MG-UNIT TABS Take 1 tablet by mouth daily with breakfast.     . cholecalciferol (VITAMIN D) 1000 units tablet Take 4,000 Units by mouth daily.    . COLLAGEN PO Take by mouth daily. Taking 1 tablet daily    . etodolac (LODINE) 500 MG tablet Take 500 mg by mouth 2 (two) times daily.     . fexofenadine (ALLEGRA) 180 MG tablet Take 180 mg by mouth daily.    . Multiple Vitamins-Minerals (ONE-A-DAY WOMENS 50 PLUS PO) Take 1 tablet by mouth daily with breakfast.    .  PEPPERMINT OIL PO Take 1 capsule by mouth every morning.    . Probiotic CAPS Take 1 capsule by mouth daily with breakfast.    . propafenone (RYTHMOL SR) 225 MG 12 hr capsule Take 1 capsule (225 mg total) by mouth 2 (two) times daily. 180 capsule 3  . SYMBICORT 80-4.5 MCG/ACT inhaler INHALE 2 INHALATION INTO THE LUNGS 2 (TWO) TIMES DAILY  11  . trandolapril-verapamil (TARKA) 2-180 MG tablet Take 1 tablet by mouth daily. Taking 1 tablet daily.    . vitamin E 200 UNIT capsule Take 200 Units by mouth daily.     No current facility-administered medications for this visit.    Allergies  Allergen Reactions  . Apixaban     Other reaction(s): Other (See Comments) Leg tingling  . Omeprazole Diarrhea  . Diphenhydramine Palpitations  . Tape Rash    Can only wear tape and BandAids short-term, as they make her skin break out      Review of Systems negative except from HPI and PMH  Physical Exam BP 110/72 (BP Location: Left Arm, Patient Position: Sitting, Cuff Size: Normal)   Pulse 68   Ht 5\' 4"  (1.626 m)   Wt 178 lb 8 oz (81 kg)   LMP 01/22/2016   SpO2 95%   BMI 30.64 kg/m  Well developed and nourished in no acute distress HENT normal Neck supple with JVP-  flat Clear Regular rate and rhythm, no murmurs or gallops Abd-soft with active BS No Clubbing cyanosis edema Skin-warm and dry A & Oriented  Grossly normal sensory and motor function  ECG sinus @ 66 211/42   Assessment and  Plan Atrial fibrillation-persistent  SVT-long RP  Hypertension  Sleep disordered breathing  First-degree AV block (236--9/18)  BP well controlled  Palpitations better on the propafenone, will continue and she thinks she is better on the adjunctive verapamil  1AVB is notable but not much worse then off drug--continue      Current medicines are reviewed at length with the patient today .  The patient does  have concerns regarding medicines As above .

## 2019-11-02 NOTE — Patient Instructions (Signed)
Medication Instructions:  - Your physician recommends that you continue on your current medications as directed. Please refer to the Current Medication list given to you today.  *If you need a refill on your cardiac medications before your next appointment, please call your pharmacy*  Lab Work: - none ordered  If you have labs (blood work) drawn today and your tests are completely normal, you will receive your results only by: . MyChart Message (if you have MyChart) OR . A paper copy in the mail If you have any lab test that is abnormal or we need to change your treatment, we will call you to review the results.  Testing/Procedures: - none ordered  Follow-Up: At [facility], you and your health needs are our priority.  As part of our continuing mission to provide you with exceptional heart care, we have created designated Provider Care Teams.  These Care Teams include your primary Cardiologist (physician) and Advanced Practice Providers (APPs -  Physician Assistants and Nurse Practitioners) who all work together to provide you with the care you need, when you need it.  Your next appointment:   6 months  The format for your next appointment:   In Person  Provider:   Steven Klein, MD  Other Instructions - n/a  

## 2020-04-29 ENCOUNTER — Other Ambulatory Visit: Payer: Self-pay | Admitting: Internal Medicine

## 2020-04-29 DIAGNOSIS — Z1231 Encounter for screening mammogram for malignant neoplasm of breast: Secondary | ICD-10-CM

## 2020-05-16 ENCOUNTER — Encounter: Payer: Self-pay | Admitting: Internal Medicine

## 2020-05-16 ENCOUNTER — Other Ambulatory Visit: Payer: Self-pay

## 2020-05-16 ENCOUNTER — Ambulatory Visit: Payer: 59 | Admitting: Internal Medicine

## 2020-05-16 VITALS — BP 114/68 | HR 67 | Ht 64.0 in | Wt 174.0 lb

## 2020-05-16 DIAGNOSIS — I10 Essential (primary) hypertension: Secondary | ICD-10-CM | POA: Diagnosis not present

## 2020-05-16 DIAGNOSIS — I440 Atrioventricular block, first degree: Secondary | ICD-10-CM | POA: Diagnosis not present

## 2020-05-16 DIAGNOSIS — I4819 Other persistent atrial fibrillation: Secondary | ICD-10-CM

## 2020-05-16 DIAGNOSIS — I471 Supraventricular tachycardia: Secondary | ICD-10-CM

## 2020-05-16 NOTE — Patient Instructions (Signed)

## 2020-05-16 NOTE — Progress Notes (Signed)
Patient Care Team: Lynnea Ferrier, MD as PCP - General (Internal Medicine)   HPI  Diane Howe is a 64 y.o. female Seen in follow-up for recurrent atrial arrhythmias.  She had been seen 6/17. 2 distinct tachycardias and been noted 2010 including Valsalva terminated SVT and atrial fibrillation. She had undergone EP testing 2003 where "7 nodes" identified and ablated. There was apparently Hisian FOCUS which was not ablated.  She had been on verapamil.  Holter monitor had demonstrated frequent atrial ectopy as well as a short RP tachycardia, this occurring in the context of known first-degree AV block (PR interval 340) She was referred back to [facility]  >> Report --- Multiple EAT's in [place], and primary and secondary AF including ERAF after DCCV despite ibutilide. No ablation done  Verapamil was discontinued and atenolol was initiated as well as flecainide. She was started on apixoban.  She did not tolerate this well and after follow-up evaluation , and redetermine of her thromboembolic risk it was discontinued. As was flecainide;  Verapamil somepoint resumed.  Propafenone started 10/20  Antiarrhythmics Date Reason stopped  Flecainide  / Lightheadedness  Propafenone 10/20    More recently 2 different palpitations syndromes.  The first fluttering with lightheadedness which she thinks is her atrial fibrillation, the other fast and regular or consistent with the regular SVT that was noted on her monitor 2017.    Took verapamil and things have settled down.  She has 2 episodes of palpitations a week but these are very manageable.  Some are irregular some respond to Valsalva Less shortness of breath.  No edema no chest pain She has lost 40 pounds  Constipation has been an issue.  DATE PR interval QRSduration Dose  10/20  252 92 0   2/21 256 106 225  8/21 256 108 225         Thromboembolic risk factors ( age -30, HTN -1 Gender-1) for a CHADSVASc Score of 3   Past Medical History:   Diagnosis Date  . A-fib (HCC)   . AF (atrial fibrillation) (HCC)   . Allergic rhinitis   . Arthritis   . Asthma   . BPV (benign positional vertigo)   . Bronchitis   . Clotting disorder (HCC)   . Dyspepsia   . Dyspepsia   . Heart palpitations   . Hyperlipidemia   . Hypertension   . Liver disease   . Osteoarthrosis   . Palpitation   . PSVT (paroxysmal supraventricular tachycardia) (HCC)   . Restless leg syndrome   . V tach (HCC)   . Vitamin D deficiency   . Vitamin D deficiency     Past Surgical History:  Procedure Laterality Date  . ABLATION    . CESAREAN SECTION    . COLONOSCOPY    . COLONOSCOPY WITH PROPOFOL N/A 03/19/2017   Procedure: COLONOSCOPY WITH PROPOFOL;  Surgeon: Christena Deem, MD;  Location: [facility] ENDOSCOPY;  Service: Endoscopy;  Laterality: N/A;  . KNEE SURGERY    . TONSILLECTOMY      Current Outpatient Medications  Medication Sig Dispense Refill  . albuterol (PROVENTIL HFA;VENTOLIN HFA) 108 (90 Base) MCG/ACT inhaler Inhale 2 puffs into the lungs every 6 (six) hours as needed for wheezing or shortness of breath.     Marland Kitchen ascorbic acid (VITAMIN C) 1000 MG tablet Take 1,000 mg by mouth daily.    Marland Kitchen aspirin EC 81 MG tablet Take 1 tablet (81 mg total) by mouth daily.    Marland Kitchen  BIOTIN PO Take by mouth daily. Taking 1 tablet daily    . Calcium-Magnesium-Vitamin D 400-166.7-133.3 MG-MG-UNIT TABS Take 1 tablet by mouth daily with breakfast.     . cholecalciferol (VITAMIN D) 1000 units tablet Take 4,000 Units by mouth daily.    . COLLAGEN PO Take by mouth daily. Taking 1 tablet daily    . etodolac (LODINE) 500 MG tablet Take 500 mg by mouth 2 (two) times daily.     . fexofenadine (ALLEGRA) 180 MG tablet Take 180 mg by mouth daily.    . Multiple Vitamins-Minerals (ONE-A-DAY WOMENS 50 PLUS PO) Take 1 tablet by mouth daily with breakfast.    . PEPPERMINT OIL PO Take 1 capsule by mouth every morning.    . Probiotic CAPS Take 1 capsule by mouth daily with breakfast.    .  propafenone (RYTHMOL SR) 225 MG 12 hr capsule Take 1 capsule (225 mg total) by mouth 2 (two) times daily. 180 capsule 3  . SYMBICORT 80-4.5 MCG/ACT inhaler INHALE 2 INHALATION INTO THE LUNGS 2 (TWO) TIMES DAILY  11  . trandolapril-verapamil (TARKA) 2-180 MG tablet Take 1 tablet by mouth daily. Taking 1 tablet daily.    . verapamil (CALAN-SR) 120 MG CR tablet Take 1 tablet (120 mg) by mouth once daily 90 tablet 3  . vitamin E 200 UNIT capsule Take 200 Units by mouth daily.     No current facility-administered medications for this visit.    Allergies  Allergen Reactions  . Apixaban     Other reaction(s): Other (See Comments) Leg tingling  . Omeprazole Diarrhea  . Diphenhydramine Palpitations  . Tape Rash    Can only wear tape and BandAids short-term, as they make her skin break out      Review of Systems negative except from HPI and PMH  Physical Exam BP 114/68 (BP Location: Left Arm, Patient Position: Sitting, Cuff Size: Normal)   Pulse 67   Ht 5\' 4"  (1.626 m)   Wt 174 lb (78.9 kg)   LMP 01/22/2016   SpO2 94%   BMI 29.87 kg/m  Well developed and nourished in no acute distress HENT normal Neck supple with JVP-  Flat  Clear Regular rate and rhythm, no murmurs or gallops Abd-soft with active BS No Clubbing cyanosis edema Skin-warm and dry A & Oriented  Grossly normal sensory and motor function     ECG sinus @ 67 26/11/42   Assessment and  Plan Atrial fibrillation-persistent  SVT-long RP  Hypertension  Sleep disordered breathing  First-degree AV block (236--9/18)   BP much better controlled especially with her weight loss.  She might be able to come off of her trandolapril.  Palpitations remain controlled largely.  She has bought an 28/11/42.  Tracings were reviewed.  Difficult to discern P waves with her palpitations.  There was an episode of classified as possible atrial fibrillation.  Do not think so.  Discussed again the issue of aspirin.  Dr.  Engineer, structural has strongly encouraged her to continue aspirin although she was agreeable to decreasing from 325--81.  She had another conversation with Dr. Thalia Party as to whether she needed anticoagulation in fact.  At that point, he told her CHA2DS2-VASc score was one, but really zero because of it was only gender, she remains reluctant to undergo anticoagulation in the absence of documentation of recurrent atrial fibrillation is hard to be to dogmatic about this.    Current medicines are reviewed at length with the patient today .  The patient does  have concerns regarding medicines As above .

## 2020-05-23 ENCOUNTER — Ambulatory Visit
Admission: RE | Admit: 2020-05-23 | Discharge: 2020-05-23 | Disposition: A | Discharge status: Home / Self Care | Payer: 59 | Source: Ambulatory Visit | Attending: Internal Medicine | Admitting: Internal Medicine

## 2020-05-23 ENCOUNTER — Other Ambulatory Visit: Payer: Self-pay

## 2020-05-23 DIAGNOSIS — Z1231 Encounter for screening mammogram for malignant neoplasm of breast: Secondary | ICD-10-CM | POA: Diagnosis present

## 2020-06-14 ENCOUNTER — Other Ambulatory Visit: Payer: Self-pay | Admitting: Internal Medicine

## 2020-11-07 ENCOUNTER — Other Ambulatory Visit: Payer: Self-pay | Admitting: Internal Medicine

## 2020-11-08 NOTE — Telephone Encounter (Signed)
This is a [place] pt 

## 2020-11-21 ENCOUNTER — Encounter: Payer: Self-pay | Admitting: Internal Medicine

## 2020-11-21 ENCOUNTER — Ambulatory Visit: Payer: 59 | Admitting: Internal Medicine

## 2020-11-21 ENCOUNTER — Other Ambulatory Visit: Payer: Self-pay

## 2020-11-21 VITALS — BP 118/72 | HR 72 | Ht 64.0 in | Wt 179.6 lb

## 2020-11-21 DIAGNOSIS — I4819 Other persistent atrial fibrillation: Secondary | ICD-10-CM

## 2020-11-21 DIAGNOSIS — I10 Essential (primary) hypertension: Secondary | ICD-10-CM | POA: Diagnosis not present

## 2020-11-21 DIAGNOSIS — I440 Atrioventricular block, first degree: Secondary | ICD-10-CM

## 2020-11-21 DIAGNOSIS — I471 Supraventricular tachycardia: Secondary | ICD-10-CM

## 2020-11-21 MED ORDER — VERAPAMIL HCL 40 MG PO TABS: ORAL_TABLET | ORAL | 0 refills | Status: DC

## 2020-11-21 MED ORDER — DILTIAZEM HCL ER COATED BEADS 120 MG PO CP24: ORAL_CAPSULE | ORAL | 3 refills | Status: DC

## 2020-11-21 NOTE — Patient Instructions (Addendum)
Medication Instructions:  - Your physician has recommended you make the following change in your medication:   1) STOP long acting verapamil  2) START diltiazem 120 mg- take 1 capsule by mouth once daily  3) START short acting verapamil 40 mg- take 1 tablet by mouth x 1 dose as needed for recurrent tachycardia  *If you need a refill on your cardiac medications before your next appointment, please call your pharmacy*   Lab Work: - none ordered  If you have labs (blood work) drawn today and your tests are completely normal, you will receive your results only by: Marland Kitchen MyChart Message (if you have MyChart) OR . A paper copy in the mail If you have any lab test that is abnormal or we need to change your treatment, we will call you to review the results.   Testing/Procedures: - none ordered  Follow-Up: At [facility], you and your health needs are our priority.  As part of our continuing mission to provide you with exceptional heart care, we have created designated Provider Care Teams.  These Care Teams include your primary Cardiologist (physician) and Advanced Practice Providers (APPs -  Physician Assistants and Nurse Practitioners) who all work together to provide you with the care you need, when you need it.  We recommend signing up for the patient portal called "MyChart".  Sign up information is provided on this After Visit Summary.  MyChart is used to connect with patients for Virtual Visits (Telemedicine).  Patients are able to view lab/test results, encounter notes, upcoming appointments, etc.  Non-urgent messages can be sent to your provider as well.   To learn more about what you can do with MyChart, go to [email or web address].    Your next appointment:    1 year  The format for your next appointment:   In Person  Provider:   Sherryl Manges, MD   Other Instructions n/a

## 2020-11-21 NOTE — Progress Notes (Signed)
Patient Care Team: Lynnea Ferrier, MD as PCP - General (Internal Medicine)   HPI  Diane Howe is a 65 y.o. female Seen in follow-up for recurrent atrial arrhythmias wi/ 2 distinct tachycardias noted 2010 including Valsalva terminated SVT and atrial fibrillation.   She had undergone EP testing 2003 ( at [facility]) where "7 nodes" identified and ablated. There was apparently Hisian FOCUS which was not ablated.  Rx w verapamil.  Holter monitor had demonstrated frequent atrial ectopy as well as a short RP tachycardia, this occurring in the context of known first-degree AV block (PR interval 340) She was referred back to [facility]  >> Report --- Multiple EAT's in [place], and primary and secondary AF including ERAF after DCCV despite ibutilide. No ablation done  Verapamil was discontinued and atenolol was initiated as well as flecainide. She was started on apixoban.  She did not tolerate this well and after follow-up evaluation and reassessment of her thromboembolic risk anticoagulation and flecainide were discontinue. Verapamil somepoint resumed.  Propafenone started 10/20  Antiarrhythmics Date Reason stopped  Flecainide  / Lightheadedness  Propafenone 10/20    Struggles with constipation which she reports worsened with propafenone  Infrequent palps, assoc with weakness and LH and dyspnea, slows and then resumes with valsalva, dur about 1 hrs   The patient denies chest pain, shortness of breath, nocturnal dyspnea, orthopnea or peripheral edema.  There have been no syncope but palps as above.      DATE PR interval QRSduration Dose  10/20  252 92 0   2/21 256 106 225  8/21 256 108 225  2/22 252 102 225    Thromboembolic risk factors ( age -38, HTN -1 Gender-1) for a CHADSVASc Score of 3   Past Medical History:  Diagnosis Date  . AF (atrial fibrillation) (HCC)   . Allergic rhinitis   . Arthritis   . Asthma   . BPV (benign positional vertigo)   . Bronchitis   . Clotting disorder  (HCC)   . Dyspepsia   . Heart palpitations   . Hyperlipidemia   . Hypertension   . Liver disease   . Osteoarthrosis   . Palpitation   . PSVT (paroxysmal supraventricular tachycardia) (HCC)   . Restless leg syndrome   . V tach (HCC)   . Vitamin D deficiency     Past Surgical History:  Procedure Laterality Date  . ABLATION    . CESAREAN SECTION    . COLONOSCOPY    . COLONOSCOPY WITH PROPOFOL N/A 03/19/2017   Procedure: COLONOSCOPY WITH PROPOFOL;  Surgeon: Christena Deem, MD;  Location: [facility] ENDOSCOPY;  Service: Endoscopy;  Laterality: N/A;  . KNEE SURGERY    . TONSILLECTOMY      Current Outpatient Medications  Medication Sig Dispense Refill  . albuterol (PROVENTIL HFA;VENTOLIN HFA) 108 (90 Base) MCG/ACT inhaler Inhale 2 puffs into the lungs every 6 (six) hours as needed for wheezing or shortness of breath.     Marland Kitchen ascorbic acid (VITAMIN C) 1000 MG tablet Take 1,000 mg by mouth daily.    Marland Kitchen aspirin EC 81 MG tablet Take 1 tablet (81 mg total) by mouth daily.    Marland Kitchen BIOTIN PO Take by mouth daily. Taking 1 tablet daily    . Calcium-Magnesium-Vitamin D 400-166.7-133.3 MG-MG-UNIT TABS Take 1 tablet by mouth daily with breakfast.     . cholecalciferol (VITAMIN D) 1000 units tablet Take 4,000 Units by mouth daily.    . COLLAGEN  PO Take by mouth daily. Taking 1 tablet daily    . etodolac (LODINE) 500 MG tablet Take 500 mg by mouth 2 (two) times daily.     . fexofenadine (ALLEGRA) 180 MG tablet Take 180 mg by mouth daily.    . Multiple Vitamins-Minerals (ONE-A-DAY WOMENS 50 PLUS PO) Take 1 tablet by mouth daily with breakfast.    . PEPPERMINT OIL PO Take 1 capsule by mouth every morning.    . Probiotic CAPS Take 1 capsule by mouth daily with breakfast.    . propafenone (RYTHMOL SR) 225 MG 12 hr capsule TAKE 1 CAPSULE BY MOUTH  TWICE DAILY 180 capsule 3  . SYMBICORT 80-4.5 MCG/ACT inhaler INHALE 2 INHALATION INTO THE LUNGS 2 (TWO) TIMES DAILY  11  . vitamin E 200 UNIT capsule Take 200 Units  by mouth daily.    Marland Kitchen diltiazem (CARDIZEM CD) 120 MG 24 hr capsule Take 1 capsule (120 mg) by mouth once daily 90 capsule 3  . verapamil (CALAN) 40 MG tablet Take 1 tablet (40 mg) x 1 dose as needed for recurrent tachycardia 30 tablet 0   No current facility-administered medications for this visit.    Allergies  Allergen Reactions  . Apixaban     Other reaction(s): Other (See Comments) Leg tingling  . Omeprazole Diarrhea  . Diphenhydramine Palpitations  . Tape Rash    Can only wear tape and BandAids short-term, as they make her skin break out      Review of Systems negative except from HPI and PMH  Physical Exam BP 118/72   Pulse 72   Ht 5\' 4"  (1.626 m)   Wt 179 lb 9.6 oz (81.5 kg)   LMP 01/22/2016   SpO2 97%   BMI 30.83 kg/m  Well developed and nourished in no acute distress HENT normal Neck supple with JVP-  flat   Clear Regular rate and rhythm, no murmurs or gallops Abd-soft with active BS No Clubbing cyanosis edema Skin-warm and dry A & Oriented  Grossly normal sensory and motor function  ECG sinus @ 67 25/10/43    Assessment and  Plan Atrial fibrillation-persistent  SVT-long RP  Hypertension  Sleep disordered breathing  First-degree AV block (236--9/18)  Constipation   Conduction disease stable on 1C and concomitant CCB.  Will continue.  But constipation may be worsened by verapamil, although she ascribes it largely to the addition of propafenone.  Will change to dilt << verapamil to see if we can mitigate some of this.  If frequency increases will go back to verapamil   Her symptoms with recurrent but infrequent episodes are challenging so will add short acting verap to take with event.  If freq increases, can increase the propafenone or CCB dose    BP controlled on current meds and with weightloss  Current medicines are reviewed at length with the patient today .  The patient does  have concerns regarding medicines As above .

## 2021-01-16 ENCOUNTER — Other Ambulatory Visit: Payer: Self-pay | Admitting: Internal Medicine

## 2021-01-17 NOTE — Telephone Encounter (Signed)
This is a [place] pt 

## 2021-01-17 NOTE — Telephone Encounter (Signed)
Rx request sent to pharmacy.  

## 2021-05-22 ENCOUNTER — Other Ambulatory Visit: Payer: Self-pay | Admitting: Internal Medicine

## 2021-09-04 ENCOUNTER — Other Ambulatory Visit: Payer: Self-pay | Admitting: Internal Medicine

## 2021-11-15 ENCOUNTER — Other Ambulatory Visit: Payer: Self-pay | Admitting: Internal Medicine

## 2021-11-17 NOTE — Telephone Encounter (Signed)
Please schedule 1 year F/U appointment.

## 2021-11-18 NOTE — Telephone Encounter (Signed)
LVM to schedule

## 2021-11-19 NOTE — Telephone Encounter (Signed)
Attempted to schedule.  LMOV to call office.  ° °

## 2021-12-01 ENCOUNTER — Ambulatory Visit
Admission: RE | Admit: 2021-12-01 | Discharge: 2021-12-01 | Disposition: A | Discharge status: Home / Self Care | Payer: 59 | Source: Ambulatory Visit | Attending: Internal Medicine | Admitting: Internal Medicine

## 2021-12-01 ENCOUNTER — Other Ambulatory Visit: Payer: Self-pay

## 2021-12-01 ENCOUNTER — Other Ambulatory Visit: Payer: Self-pay | Admitting: Internal Medicine

## 2021-12-01 DIAGNOSIS — Z1231 Encounter for screening mammogram for malignant neoplasm of breast: Secondary | ICD-10-CM

## 2022-01-08 ENCOUNTER — Ambulatory Visit: Payer: 59 | Admitting: Internal Medicine

## 2022-01-08 ENCOUNTER — Encounter: Payer: Self-pay | Admitting: Internal Medicine

## 2022-01-08 VITALS — BP 130/88 | HR 73 | Ht 64.0 in | Wt 203.0 lb

## 2022-01-08 DIAGNOSIS — I440 Atrioventricular block, first degree: Secondary | ICD-10-CM

## 2022-01-08 DIAGNOSIS — I471 Supraventricular tachycardia: Secondary | ICD-10-CM | POA: Diagnosis not present

## 2022-01-08 DIAGNOSIS — I4819 Other persistent atrial fibrillation: Secondary | ICD-10-CM

## 2022-01-08 DIAGNOSIS — I10 Essential (primary) hypertension: Secondary | ICD-10-CM

## 2022-01-08 MED ORDER — PROPAFENONE HCL ER 325 MG PO CP12: 325.0000 mg | ORAL_CAPSULE | Freq: Two times a day (BID) | ORAL | 3 refills | Status: DC

## 2022-01-08 NOTE — Patient Instructions (Addendum)
Medication Instructions:  ?- Your physician has recommended you make the following change in your medication:  ? ?1) CHANGE Ryhmol (propafenone) to 325 mg: ?- take 1 capsule by mouth TWICE daily  ? ?(you may use your current 225 mg capsules- taking 1 capsule every 8 hours until your they are gone) ? ?*If you need a refill on your cardiac medications before your next appointment, please call your pharmacy* ? ? ?Lab Work: ?- none ordered ? ?If you have labs (blood work) drawn today and your tests are completely normal, you will receive your results only by: ?MyChart Message (if you have MyChart) OR ?A paper copy in the mail ?If you have any lab test that is abnormal or we need to change your treatment, we will call you to review the results. ? ? ?Testing/Procedures: ?- Nurse visit EKG in 2 weeks: Change in Rythmol dose ? ? ?Follow-Up: ?At [facility], you and your health needs are our priority.  As part of our continuing mission to provide you with exceptional heart care, we have created designated Provider Care Teams.  These Care Teams include your primary Cardiologist (physician) and Advanced Practice Providers (APPs -  Physician Assistants and Nurse Practitioners) who all work together to provide you with the care you need, when you need it. ? ?We recommend signing up for the patient portal called "MyChart".  Sign up information is provided on this After Visit Summary.  MyChart is used to connect with patients for Virtual Visits (Telemedicine).  Patients are able to view lab/test results, encounter notes, upcoming appointments, etc.  Non-urgent messages can be sent to your provider as well.   ?To learn more about what you can do with MyChart, go to [email or web address].   ? ?Your next appointment:   ?1 year(s) ? ?The format for your next appointment:   ?In Person ? ?Provider:   ?Sherryl Manges, MD  ? ? ?Other Instructions ?N/a ? ?Important Information About Sugar ? ? ? ? ? ? ?

## 2022-01-08 NOTE — Progress Notes (Signed)
? ? ? ? ?Patient Care Team: ?Lynnea Ferrier, MD as PCP - General (Internal Medicine) ? ? ?HPI ? ?Diane Howe is a 66 y.o. female ?Seen in follow-up for recurrent atrial arrhythmias wi/ 2 distinct tachycardias noted 2010 including Valsalva terminated SVT and atrial fibrillation.  ? ?She had undergone EP testing 2003 ( at [facility]) where "7 nodes" identified and ablated. There was apparently Hisian FOCUS which was not ablated.  Rx w verapamil. ? ?Holter monitor had demonstrated frequent atrial ectopy as well as a short RP tachycardia, this occurring in the context of known first-degree AV block (PR interval 340) ?She was referred back to [facility]  >> Report --- Multiple EAT's in [place], and primary and secondary AF including ERAF after DCCV despite ibutilide. No ablation done ? ?Verapamil was discontinued and atenolol was initiated as well as flecainide. She was started on apixoban.  She did not tolerate this well and after follow-up evaluation and reassessment of her thromboembolic risk anticoagulation and flecainide were discontinue. Verapamil somepoint resumed.  Propafenone started 10/20 and verapamil changed to dilt 2/2 constipation  ? ?Constipation has been better.  Her palpitations are much less common.  But when they are they are more severe.  Associate with shortness of breath and lightheadedness.  AliveCor recordings are described below ? ?No bleeding ? ?Antiarrhythmics Date Reason stopped  ?Flecainide  / Lightheadedness  ?Propafenone 10/20   ? ?   ? ?COVID 5/22 ?Asymptomatic ?  ?Date Cr K Hgb  ?10/22 0.81 4.5 12.7  ?      ? ? ? ?DATE PR interval QRSduration Dose  ?10/20  252 92 0  ? 2/21 256 106 225  ?8/21 256 108 225  ?2/22 252 102 225  ?4/23 216 114 225  ? ? ?Thromboembolic risk factors ( age -62, HTN -1 Gender-1) for a CHADSVASc Score of 3 ? ? ?Past Medical History:  ?Diagnosis Date  ? AF (atrial fibrillation) (HCC)   ? Allergic rhinitis   ? Arthritis   ? Asthma   ? BPV (benign positional vertigo)   ?  Bronchitis   ? Clotting disorder (HCC)   ? Dyspepsia   ? Heart palpitations   ? Hyperlipidemia   ? Hypertension   ? Liver disease   ? Osteoarthrosis   ? Palpitation   ? PSVT (paroxysmal supraventricular tachycardia) (HCC)   ? Restless leg syndrome   ? V tach (HCC)   ? Vitamin D deficiency   ? ? ?Past Surgical History:  ?Procedure Laterality Date  ? ABLATION    ? CESAREAN SECTION    ? COLONOSCOPY    ? COLONOSCOPY WITH PROPOFOL N/A 03/19/2017  ? Procedure: COLONOSCOPY WITH PROPOFOL;  Surgeon: Christena Deem, MD;  Location: [facility] ENDOSCOPY;  Service: Endoscopy;  Laterality: N/A;  ? KNEE SURGERY    ? TONSILLECTOMY    ? ? ?Current Outpatient Medications  ?Medication Sig Dispense Refill  ? albuterol (PROVENTIL HFA;VENTOLIN HFA) 108 (90 Base) MCG/ACT inhaler Inhale 2 puffs into the lungs every 6 (six) hours as needed for wheezing or shortness of breath.     ? ascorbic acid (VITAMIN C) 1000 MG tablet Take 1,000 mg by mouth daily.    ? aspirin EC 81 MG tablet Take 1 tablet (81 mg total) by mouth daily.    ? BIOTIN PO Take by mouth daily. Taking 1 tablet daily    ? Calcium-Magnesium-Vitamin D 400-166.7-133.3 MG-MG-UNIT TABS Take 1 tablet by mouth daily with breakfast.     ?  cholecalciferol (VITAMIN D) 1000 units tablet Take 4,000 Units by mouth daily.    ? COLLAGEN PO Take by mouth daily. Taking 1 tablet daily    ? diltiazem (CARDIZEM CD) 120 MG 24 hr capsule TAKE 1 CAPSULE BY MOUTH  ONCE DAILY. PLEASE CALL OFFICE TO SCHEDULE AN APPOINTMENT. MUST BE SEEN IN CLINIC FOR FURTHER REFILLS. 90 capsule 0  ? ergocalciferol (VITAMIN D2) 1.25 MG (50000 UT) capsule Take 1 capsule by mouth once a week.    ? etodolac (LODINE) 500 MG tablet Take 500 mg by mouth 2 (two) times daily.     ? fexofenadine (ALLEGRA) 180 MG tablet Take 180 mg by mouth daily.    ? Multiple Vitamins-Minerals (ONE-A-DAY WOMENS 50 PLUS PO) Take 1 tablet by mouth daily with breakfast.    ? PEPPERMINT OIL PO Take 1 capsule by mouth every morning.    ? Probiotic CAPS  Take 1 capsule by mouth daily with breakfast.    ? propafenone (RYTHMOL SR) 325 MG 12 hr capsule Take 1 capsule (325 mg total) by mouth 2 (two) times daily. 180 capsule 3  ? SYMBICORT 80-4.5 MCG/ACT inhaler INHALE 2 INHALATION INTO THE LUNGS 2 (TWO) TIMES DAILY  11  ? verapamil (CALAN) 40 MG tablet TAKE 1 TABLET BY MOUTH FOR  1 DOSE PER EPISODE AS  NEEDED FOR RECURRENT  TACHYCARDIA AS DIRECTED BY  MD 30 tablet 3  ? vitamin E 200 UNIT capsule Take 200 Units by mouth daily.    ? ?No current facility-administered medications for this visit.  ? ? ?Allergies  ?Allergen Reactions  ? Apixaban   ?  Other reaction(s): Other (See Comments) ?Leg tingling  ? Omeprazole Diarrhea  ? Diphenhydramine Palpitations  ? Tape Rash  ?  Can only wear tape and BandAids short-term, as they make her skin break out  ? ? ? ? ?Review of Systems negative except from HPI and PMH ? ?Physical Exam ?BP 130/88 (BP Location: Left Arm, Patient Position: Sitting, Cuff Size: Large)   Pulse 73   Ht 5\' 4"  (1.626 m)   Wt 203 lb (92.1 kg)   LMP 01/22/2016   SpO2 94%   BMI 34.84 kg/m?  ?Well developed and nourished in no acute distress ?HENT normal ?Neck supple with JVP-  flat  ?Clear ?Regular rate and rhythm, no murmurs or gallops ?Abd-soft with active BS ?No Clubbing cyanosis edema ?Skin-warm and dry ?A & Oriented  Grossly normal sensory and motor function ? ?ECG sinus at 73 ?Intervals 22/11/41 ? ?AliveCor tracings were reviewed and demonstrated multiple tachycardias some with easily recognizable P waves although not (all consistent with what was found in EP testing) ? ? ?Assessment and  Plan ?Atrial fibrillation/flutter-persistent ?  ?SVT-long RP ? ?Hypertension ? ?First-degree AV block (236--9/18) ? ?Constipation  ? ?Less frequent but more severe tachy palpitations.  Not a candidate for further ablation apart from the AV node.  Hence, drug options are the most reasonable and we will start with increasing her propafenone from 225--325 twice daily and  if symptoms persist, she uses an Apple Watch and conservative a heart rate, we can increase her Cardizem from 120--180 as necessary. ? ?Conduction parameters on the propafenone are reasonable.  Suspect will be just fine at 325 twice daily but we will check an ECG in about 2 weeks. ? ?Blood pressure is reasonably controlled on diltiazem 120.  Continue. ? ?  ? ?

## 2022-01-09 NOTE — Addendum Note (Signed)
Addended by: Theola Sequin on: 01/09/2022 09:35 AM ? ? Modules accepted: Orders ? ?

## 2022-01-21 ENCOUNTER — Ambulatory Visit (INDEPENDENT_AMBULATORY_CARE_PROVIDER_SITE_OTHER): Payer: 59

## 2022-01-21 VITALS — BP 130/70 | HR 66 | Wt 202.0 lb

## 2022-01-21 DIAGNOSIS — I471 Supraventricular tachycardia: Secondary | ICD-10-CM | POA: Diagnosis not present

## 2022-01-21 DIAGNOSIS — I4819 Other persistent atrial fibrillation: Secondary | ICD-10-CM | POA: Diagnosis not present

## 2022-01-21 NOTE — Progress Notes (Signed)
Ekg ? ?Nurse Visit  ? ?Date of Encounter: 01/21/2022 ?ID: Diane Howe, DOB [birth year], MRN [id number] ? ?PCP:  Lynnea Ferrier, MD ?  ?[facility] Providers ?Cardiologist: Sherryl Manges, MD ?Click to update primary MD,subspecialty MD or APP then REFRESH:1}   ? ? ?Visit Details  ? ?VS:  LMP 01/22/2016  , BMI There is no height or weight on file to calculate BMI. ? ?Wt Readings from Last 3 Encounters:  ?01/08/22 203 lb (92.1 kg)  ?11/21/20 179 lb 9.6 oz (81.5 kg)  ?05/16/20 174 lb (78.9 kg)  ?  ? ?Reason for visit: Follow up EKG for dose increase of Rythmol ?Performed today: Vitals, EKG, Provider consulted: , and Education ?Changes (medications, testing, etc.) : No changes ?Length of Visit: 15 minutes ? ? ?Patient stated that her heart rate has remained WNL, and did not drop with the new medication increase. ? ?Medications Adjustments/Labs and Tests Ordered: ?No orders of the defined types were placed in this encounter. ? ?No orders of the defined types were placed in this encounter. ? ? ? ?Signed, ?Gibson Ramp, RN  ?01/21/2022 11:14 AM  ?

## 2022-02-08 ENCOUNTER — Other Ambulatory Visit: Payer: Self-pay | Admitting: Internal Medicine

## 2022-04-07 ENCOUNTER — Other Ambulatory Visit: Payer: Self-pay | Admitting: Internal Medicine

## 2022-04-07 DIAGNOSIS — R102 Pelvic and perineal pain: Secondary | ICD-10-CM

## 2022-04-17 ENCOUNTER — Ambulatory Visit
Admission: RE | Admit: 2022-04-17 | Discharge: 2022-04-17 | Disposition: A | Discharge status: Home / Self Care | Payer: 59 | Source: Ambulatory Visit | Attending: Internal Medicine | Admitting: Internal Medicine

## 2022-04-17 DIAGNOSIS — R102 Pelvic and perineal pain: Secondary | ICD-10-CM

## 2022-05-05 ENCOUNTER — Other Ambulatory Visit: Payer: Self-pay | Admitting: Orthopedic Surgery

## 2022-05-05 DIAGNOSIS — S3981XA Other specified injuries of abdomen, initial encounter: Secondary | ICD-10-CM

## 2022-05-05 DIAGNOSIS — M47816 Spondylosis without myelopathy or radiculopathy, lumbar region: Secondary | ICD-10-CM

## 2022-05-05 DIAGNOSIS — M5416 Radiculopathy, lumbar region: Secondary | ICD-10-CM

## 2022-05-05 DIAGNOSIS — M5136 Other intervertebral disc degeneration, lumbar region: Secondary | ICD-10-CM

## 2022-05-20 ENCOUNTER — Ambulatory Visit
Admission: RE | Admit: 2022-05-20 | Discharge: 2022-05-20 | Disposition: A | Discharge status: Home / Self Care | Payer: 59 | Source: Ambulatory Visit | Attending: Orthopedic Surgery | Admitting: Orthopedic Surgery

## 2022-05-20 DIAGNOSIS — M5416 Radiculopathy, lumbar region: Secondary | ICD-10-CM

## 2022-05-20 DIAGNOSIS — M47816 Spondylosis without myelopathy or radiculopathy, lumbar region: Secondary | ICD-10-CM

## 2022-05-20 DIAGNOSIS — S3981XA Other specified injuries of abdomen, initial encounter: Secondary | ICD-10-CM

## 2022-05-20 DIAGNOSIS — M5136 Other intervertebral disc degeneration, lumbar region: Secondary | ICD-10-CM

## 2022-12-28 ENCOUNTER — Other Ambulatory Visit: Payer: Self-pay | Admitting: Internal Medicine

## 2023-01-27 NOTE — Progress Notes (Unsigned)
Cardiology Office Note Date:  01/28/2023  Patient ID:  Diane, Howe [birth year], MRN [id number] PCP:  Diane Ferrier, MD  Cardiologist:  None Electrophysiologist: Diane Manges, MD    Chief Complaint: 1 year follow-up SVT, AFib  History of Present Illness: Diane Howe is a 67 y.o. female with PMH notable for persistent AFib, Aflutter, SVT w long RP, HTN, 1st deg HB; seen today for Diane Manges, MD for routine electrophysiology followup.  She last saw Dr. Graciela Howe 12/2021, not a candidate for further ablation of arrhythmias. Having less freq but more severe tachy palptations. Increased propafenone to 325 BID. Follow-up EKG 2 weeks later showed stable intervals.   Today, she tells me that she is very happy with her level of palpitation control. She used to have daily, very severe "thundering" in her chest that lasted for days at times. She now is able to go months without an episode. Has had ~5 since last being seen. When they occur, much more manageable, don't last as long.  She, separately, has chest fluttering-like episodes. Has only had one of these in the past year, lasted a couple hours.   She checks BP most days of the week at various times of the day. She says readings are all over the place, varying from 150s/160s one day and then 90/100s the following day. PCP has been managing BP medications, and she has follow-up with PCP Dr. Graciela Howe soon.   she denies chest pain, dyspnea, PND, orthopnea, nausea, vomiting, dizziness, syncope, edema, weight gain, or early satiety.    AAD History: Flecainide, stopped d/t lightheadedness Propafenone started 06/2019  Past Medical History:  Diagnosis Date   AF (atrial fibrillation) (HCC)    Allergic rhinitis    Arthritis    Asthma    BPV (benign positional vertigo)    Bronchitis    Clotting disorder (HCC)    Dyspepsia    Heart palpitations    Hyperlipidemia    Hypertension    Liver disease    Osteoarthrosis    Palpitation    PSVT  (paroxysmal supraventricular tachycardia)    Restless leg syndrome    V tach (HCC)    Vitamin D deficiency     Past Surgical History:  Procedure Laterality Date   ABLATION     CESAREAN SECTION     COLONOSCOPY     COLONOSCOPY WITH PROPOFOL N/A 03/19/2017   Procedure: COLONOSCOPY WITH PROPOFOL;  Surgeon: Christena Deem, MD;  Location: [facility] ENDOSCOPY;  Service: Endoscopy;  Laterality: N/A;   KNEE SURGERY     TONSILLECTOMY      Current Outpatient Medications  Medication Instructions   albuterol (PROVENTIL HFA;VENTOLIN HFA) 108 (90 Base) MCG/ACT inhaler 2 puffs, Inhalation, Every 6 hours PRN   ascorbic acid (VITAMIN C) 1,000 mg, Oral, Daily   aspirin EC 81 mg, Oral, Daily   BIOTIN PO Oral, Daily, Taking 1 tablet daily    Calcium-Magnesium-Vitamin D 400-166.7-133.3 MG-MG-UNIT TABS 1 tablet, Oral, Daily with breakfast   cholecalciferol (VITAMIN D) 1,000 Units, Oral, Daily   COLLAGEN PO Oral, Daily, Taking 1 tablet daily    diltiazem (CARDIZEM CD) 120 MG 24 hr capsule TAKE 1 CAPSULE BY MOUTH ONCE  DAILY.   ergocalciferol (VITAMIN D2) 1.25 MG (50000 UT) capsule 1 capsule, Oral, Weekly   etodolac (LODINE) 500 mg, Oral, 2 times daily   fexofenadine (ALLEGRA) 180 mg, Oral, Daily   Multiple Vitamins-Minerals (ONE-A-DAY WOMENS 50 PLUS PO) 1 tablet, Oral, Daily with breakfast  PEPPERMINT OIL PO 1 capsule, Oral, Every morning   Probiotic CAPS 1 capsule, Oral, Daily with breakfast   propafenone (RYTHMOL SR) 325 mg, Oral, 2 times daily   SYMBICORT 80-4.5 MCG/ACT inhaler INHALE 2 INHALATION INTO THE LUNGS 2 (TWO) TIMES DAILY   verapamil (CALAN) 40 MG tablet TAKE 1 TABLET BY MOUTH FOR  1 DOSE PER EPISODE AS  NEEDED FOR RECURRENT  TACHYCARDIA AS DIRECTED BY  MD   vitamin E 200 Units, Oral, Daily    Social History:  The patient  reports that she has quit smoking. Her smoking use included cigarettes. She has never used smokeless tobacco. She reports that she does not currently use alcohol. She  reports that she does not use drugs.   Family History:  The patient's family history includes Alzheimer's disease in her mother; Breast cancer in her maternal aunt; Diabetes Mellitus II in her mother; Heart attack in her cousin and maternal grandfather; Throat cancer in her father.  ROS:  Please see the history of present illness. All other systems are reviewed and otherwise negative.   PHYSICAL EXAM:  VS:  BP (!) 152/90 (BP Location: Left Arm, Patient Position: Sitting, Cuff Size: Large)   Pulse 68   Ht 5\' 4"  (1.626 m)   Wt 207 lb (93.9 kg)   LMP 01/22/2016   SpO2 96%   BMI 35.53 kg/m  BMI: Body mass index is 35.53 kg/m.   Vitals:   01/28/23 0933 01/28/23 1107  BP: (!) 152/90 (!) 152/90  Pulse: 68   Height: 5\' 4"  (1.626 m)   Weight: 207 lb (93.9 kg)   SpO2: 96%   BMI (Calculated): 35.51     GEN- The patient is well appearing, alert and oriented x 3 today.   Lungs- Clear to ausculation bilaterally, normal work of breathing.  Heart- Regular rate and rhythm, no murmurs, rubs or gallops Extremities- Trace peripheral edema, warm, dry    EKG is ordered. Personal review of EKG from today shows:  NSR, rate 68bpm. Incomplete LBBB, 1st deg HB; PR  01/21/2022: EKG, NSR, rate 66bpm; Incomplete LBBB, 1st deg HB, PR    Recent Labs: No results found for requested labs within last 365 days.  No results found for requested labs within last 365 days.   CrCl cannot be calculated (Patient's most recent lab result is older than the maximum 21 days allowed.).   Wt Readings from Last 3 Encounters:  01/28/23 207 lb (93.9 kg)  01/21/22 202 lb (91.6 kg)  01/08/22 203 lb (92.1 kg)     Additional studies reviewed include: Previous EP, cardiology notes.   ETT, 08/17/2019 Blood pressure demonstrated a normal response to exercise. There was no ST segment deviation noted during stress.  TTE, 01/02/2016 - Left ventricle: The cavity size was normal. Systolic function was normal.  The estimated ejection fraction was in the range of 60% to 65%. Wall motion was normal; there were no regional wall motion abnormalities. Left ventricular diastolic function parameters were normal.  - Mitral valve: There was mild regurgitation.  - Left atrium: The atrium was normal in size.  - Right ventricle: Systolic function was normal.  - Pulmonary arteries: Systolic pressure was within the normal range.   ASSESSMENT AND PLAN:  #) persis Afib, Aflutter #) SVT #) 1st degree Heart Block Stable intervals on today's EKG She has much less symptomatic burden on propafenone 325 BID Cont dilt Cont verapamil PRN, ok to take additional dose if needed as long as  BP above 120/60  CHA2DS2-VASc Score = 3 [CHF History: 0, HTN History: 1, Diabetes History: 0, Stroke History: 0, Vascular Disease History: 0, Age Score: 1, Gender Score: 1].  Therefore, the patient's annual risk of stroke is 3.2 % Not on OAC d/t eliquis intolerance Cont 81mg  ASA     #) HTN Elevated today in office PT checks BP regularly at home and readings are "all over the place" She has PCP follow-up in 2 weeks for further med titration No change to meds at this time   Current medicines are reviewed at length with the patient today.   The patient does not have concerns regarding her medicines.  The following changes were made today:  none  Labs/ tests ordered today include:  Orders Placed This Encounter  Procedures   EKG 12-Lead    Disposition: Follow up with Dr. Graciela Howe in in 12 months   Signed, Sherie Don, NP  01/28/23  10:25 AM  Electrophysiology [facility]

## 2023-01-28 ENCOUNTER — Encounter: Payer: Self-pay | Admitting: Cardiology

## 2023-01-28 ENCOUNTER — Ambulatory Visit: Payer: 59 | Attending: Cardiology | Admitting: Cardiology

## 2023-01-28 VITALS — BP 152/90 | HR 68 | Ht 64.0 in | Wt 207.0 lb

## 2023-01-28 DIAGNOSIS — I10 Essential (primary) hypertension: Secondary | ICD-10-CM | POA: Diagnosis not present

## 2023-01-28 DIAGNOSIS — I471 Supraventricular tachycardia, unspecified: Secondary | ICD-10-CM | POA: Diagnosis not present

## 2023-01-28 DIAGNOSIS — I440 Atrioventricular block, first degree: Secondary | ICD-10-CM | POA: Diagnosis not present

## 2023-01-28 DIAGNOSIS — I4819 Other persistent atrial fibrillation: Secondary | ICD-10-CM

## 2023-01-28 MED ORDER — DILTIAZEM HCL ER COATED BEADS 120 MG PO CP24: ORAL_CAPSULE | ORAL | 2 refills | Status: DC

## 2023-01-28 NOTE — Patient Instructions (Signed)
Medication Instructions:  Your physician recommends that you continue on your current medications as directed. Please refer to the Current Medication list given to you today.  *If you need a refill on your cardiac medications before your next appointment, please call your pharmacy*   Lab Work: No labs ordered  If you have labs (blood work) drawn today and your tests are completely normal, you will receive your results only by: MyChart Message (if you have MyChart) OR A paper copy in the mail If you have any lab test that is abnormal or we need to change your treatment, we will call you to review the results.   Testing/Procedures: No testing ordered  Follow-Up: At [facility], you and your health needs are our priority.  As part of our continuing mission to provide you with exceptional heart care, we have created designated Provider Care Teams.  These Care Teams include your primary Cardiologist (physician) and Advanced Practice Providers (APPs -  Physician Assistants and Nurse Practitioners) who all work together to provide you with the care you need, when you need it.  We recommend signing up for the patient portal called "MyChart".  Sign up information is provided on this After Visit Summary.  MyChart is used to connect with patients for Virtual Visits (Telemedicine).  Patients are able to view lab/test results, encounter notes, upcoming appointments, etc.  Non-urgent messages can be sent to your provider as well.   To learn more about what you can do with MyChart, go to [email or web address].    Your next appointment:   1 year(s)  Provider:   Steven Klein, MD or Suzann Riddle, NP  

## 2023-02-15 ENCOUNTER — Other Ambulatory Visit: Payer: Self-pay | Admitting: Internal Medicine

## 2023-05-14 ENCOUNTER — Other Ambulatory Visit: Payer: Self-pay | Admitting: Internal Medicine

## 2023-05-14 DIAGNOSIS — Z1231 Encounter for screening mammogram for malignant neoplasm of breast: Secondary | ICD-10-CM

## 2023-06-01 ENCOUNTER — Ambulatory Visit
Admission: RE | Admit: 2023-06-01 | Discharge: 2023-06-01 | Disposition: A | Discharge status: Home / Self Care | Payer: 59 | Source: Ambulatory Visit | Attending: Internal Medicine | Admitting: Internal Medicine

## 2023-06-01 DIAGNOSIS — Z1231 Encounter for screening mammogram for malignant neoplasm of breast: Secondary | ICD-10-CM | POA: Diagnosis present

## 2023-12-23 ENCOUNTER — Other Ambulatory Visit: Payer: Self-pay | Admitting: Internal Medicine

## 2023-12-23 NOTE — Telephone Encounter (Signed)
 This is a Educational psychologist pt

## 2024-01-18 ENCOUNTER — Other Ambulatory Visit: Payer: Self-pay | Admitting: Cardiology

## 2024-04-22 ENCOUNTER — Other Ambulatory Visit: Payer: Self-pay | Admitting: Internal Medicine

## 2024-06-06 ENCOUNTER — Other Ambulatory Visit: Payer: Self-pay

## 2024-06-14 ENCOUNTER — Other Ambulatory Visit: Payer: Self-pay | Admitting: Cardiology

## 2024-06-14 MED ORDER — DILTIAZEM HCL ER COATED BEADS 120 MG PO CP24: 120.0000 mg | ORAL_CAPSULE | Freq: Every day | ORAL | 0 refills | Status: DC

## 2024-06-30 ENCOUNTER — Ambulatory Visit: Payer: Self-pay | Attending: Cardiology | Admitting: Cardiology

## 2024-06-30 ENCOUNTER — Encounter: Payer: Self-pay | Admitting: Cardiology

## 2024-06-30 VITALS — BP 152/78 | HR 66 | Ht 64.0 in | Wt 211.0 lb

## 2024-06-30 DIAGNOSIS — I471 Supraventricular tachycardia, unspecified: Secondary | ICD-10-CM

## 2024-06-30 DIAGNOSIS — Z79899 Other long term (current) drug therapy: Secondary | ICD-10-CM | POA: Diagnosis not present

## 2024-06-30 DIAGNOSIS — I4819 Other persistent atrial fibrillation: Secondary | ICD-10-CM

## 2024-06-30 MED ORDER — PROPAFENONE HCL ER 225 MG PO CP12: 225.0000 mg | ORAL_CAPSULE | Freq: Two times a day (BID) | ORAL | 3 refills | Status: AC

## 2024-06-30 NOTE — Patient Instructions (Addendum)
 Medication Instructions:   Decrease Propafenone  to 225 MG twice of daily. Stop diltiazem .  *If you need a refill on your cardiac medications before your next appointment, please call your pharmacy*  Lab Work:  No labs ordered today   Please have Dr. Fernande (PCP) ordered a BMP and Magnesium  If you have labs (blood work) drawn today and your tests are completely normal, you will receive your results only by: MyChart Message (if you have MyChart) OR A paper copy in the mail If you have any lab test that is abnormal or we need to change your treatment, we will call you to review the results.  Testing/Procedures: No test ordered today   Follow-Up: At [facility], you and your health needs are our priority.  As part of our continuing mission to provide you with exceptional heart care, our providers are all part of one team.  This team includes your primary Cardiologist (physician) and Advanced Practice Providers or APPs (Physician Assistants and Nurse Practitioners) who all work together to provide you with the care you need, when you need it.  Your next appointment:   3-4 month(s)  Provider:   Estiben Mizuno, NP or Dr. Kennyth    We recommend signing up for the patient portal called MyChart.  Sign up information is provided on this After Visit Summary.  MyChart is used to connect with patients for Virtual Visits (Telemedicine).  Patients are able to view lab/test results, encounter notes, upcoming appointments, etc.  Non-urgent messages can be sent to your provider as well.   To learn more about what you can do with MyChart, go to [email or web address].

## 2024-06-30 NOTE — Progress Notes (Signed)
 Electrophysiology Clinic Note    Date:  06/30/2024  Patient ID:  Diane, Howe [birth year], MRN [id number] PCP:  Diane Ophelia JINNY DOUGLAS, MD  Cardiologist:  None  Electrophysiologist:  Diane Fernande, MD  Electrophysiology APP:  Diane Nugent, NP     Discussed the use of AI scribe software for clinical note transcription with the patient, who gave verbal consent to proceed.   Patient Profile    Chief Complaint: SVT follow-up  History of Present Illness: Diane Howe is a 68 y.o. female with PMH notable for persistent AFib, Aflutter, SVT w long RP, 1st deg HB, HTN; seen today for routine electrophysiology followup.   S/p EPS 2023 with ablation of many areas per notes, not able to ablate all d/t proximity to His bundle. She had recurrence of palpitations.  I last saw her 01/2023 where she was very happy with her level of palpitaiotn control. She has two distinct types of palpitations - one described as thundering in her chest, and the other described as fluttering, both were well controlled at our last visit.  On follow-up today, she has not had any episodes of palpitations - either type- since our last appointment. She recently retired and is under considerably less stress. She does admit to being less active since retiring. She continues to take propafenone  BID, along with dilt daily. She has not needed PRN verapamil  at all.   She has PCP appt coming up next month, will get labs in the next few weeks in preparation.      Arrhythmia/Device History Flecainide, stopped d/t lightheadedness Propafenone  started 06/2019     ROS:  Please see the history of present illness. All other systems are reviewed and otherwise negative.    Physical Exam    VS:  BP (!) 152/78   Pulse 66   Ht 5' 4 (1.626 m)   Wt 211 lb (95.7 kg)   LMP 01/22/2016   SpO2 94%   BMI 36.22 kg/m  BMI: Body mass index is 36.22 kg/m.           Wt Readings from Last 3 Encounters:  06/30/24 211  lb (95.7 kg)  01/28/23 207 lb (93.9 kg)  01/21/22 202 lb (91.6 kg)     GEN- The patient is well appearing, alert and oriented x 3 today.   Lungs- Clear to ausculation bilaterally, normal work of breathing.  Heart- Regular rate and rhythm, no murmurs, rubs or gallops Extremities- Trace peripheral edema, warm, dry   Studies Reviewed   Previous EP, cardiology notes.    EKG is ordered. Personal review of EKG from today shows:    EKG Interpretation Date/Time:  Friday June 30 2024 13:39:51 EDT Ventricular Rate:  66 PR Interval:  464 QRS Duration:  110 QT Interval:  408 QTC Calculation: 427 R Axis:   37  Text Interpretation: Sinus rhythm with 1st degree A-V block Nonspecific ST abnormality Confirmed by Diane Howe [phone]) on 06/30/2024 3:09:20 PM    01/2023 EKG - SR w 1st deg HB at 68. PR 230 12/2021 EKG - SR w 1st deg HB at 66. PR 270   ETT, 08/17/2019 Blood pressure demonstrated a normal response to exercise. There was no ST segment deviation noted during stress.   TTE, 01/02/2016 - Left ventricle: The cavity size was normal. Systolic function was normal. The estimated ejection fraction was in the range of 60% to 65%. Wall motion was normal; there were no regional wall motion abnormalities. Left  ventricular diastolic function parameters were normal.  - Mitral valve: There was mild regurgitation.  - Left atrium: The atrium was normal in size.  - Right ventricle: Systolic function was normal.  - Pulmonary arteries: Systolic pressure was within the normal range.     Assessment and Plan     #) persis AFib, aflutter #) SVT #) propafenone  monitoring 1st deg HB considerably longer than previous, > today vs ~260 previously.  Reduce propafenone  to 225mg  BID Update BMP, mag with PCP labs in a few weeks, will follow results Stop dilt   #) Hypercoag d/t persis afib CHA2DS2-VASc Score = at least 3 [CHF History: 0, HTN History: 1, Diabetes History: 0, Stroke History: 0,  Vascular Disease History: 0, Age Score: 1, Gender Score: 1].  Therefore, the patient's annual risk of stroke is 3.2 %.    Stroke ppx - 81mg  ASA   #) HTN Elevated in office today, normal by home readings Recommend she continue to monitor BP readings at home with stopping dilt Bring BP log to PCP appt next month         Current medicines are reviewed at length with the patient today.   The patient does not have concerns regarding her medicines.  The following changes were made today:   REDUCE propafenone  to 225mg  BID STOP diltiazem   Labs/ tests ordered today include:  Orders Placed This Encounter  Procedures   EKG 12-Lead     Disposition: Follow up with Diane Howe or EP APP 2-3 months    Signed, Diane Needle, NP  06/30/24  3:10 PM  Electrophysiology [facility]

## 2024-08-21 ENCOUNTER — Other Ambulatory Visit: Payer: Self-pay | Admitting: Internal Medicine

## 2024-08-21 DIAGNOSIS — Z1231 Encounter for screening mammogram for malignant neoplasm of breast: Secondary | ICD-10-CM

## 2024-08-23 ENCOUNTER — Other Ambulatory Visit: Payer: Self-pay | Admitting: Internal Medicine

## 2024-08-23 DIAGNOSIS — M4807 Spinal stenosis, lumbosacral region: Secondary | ICD-10-CM

## 2024-08-23 DIAGNOSIS — G959 Disease of spinal cord, unspecified: Secondary | ICD-10-CM

## 2024-08-31 ENCOUNTER — Ambulatory Visit
Admission: RE | Admit: 2024-08-31 | Discharge: 2024-08-31 | Disposition: A | Source: Ambulatory Visit | Attending: Internal Medicine | Admitting: Internal Medicine

## 2024-08-31 DIAGNOSIS — M4807 Spinal stenosis, lumbosacral region: Secondary | ICD-10-CM | POA: Insufficient documentation

## 2024-08-31 DIAGNOSIS — G959 Disease of spinal cord, unspecified: Secondary | ICD-10-CM | POA: Diagnosis present

## 2024-10-04 ENCOUNTER — Ambulatory Visit
Admission: RE | Admit: 2024-10-04 | Discharge: 2024-10-04 | Disposition: A | Discharge status: Home / Self Care | Source: Ambulatory Visit | Attending: Internal Medicine | Admitting: Internal Medicine

## 2024-10-04 DIAGNOSIS — Z1231 Encounter for screening mammogram for malignant neoplasm of breast: Secondary | ICD-10-CM | POA: Insufficient documentation

## 2024-10-05 ENCOUNTER — Ambulatory Visit: Attending: Cardiology | Admitting: Cardiology

## 2024-10-05 ENCOUNTER — Encounter: Payer: Self-pay | Admitting: Cardiology

## 2024-10-05 VITALS — BP 132/72 | HR 72 | Ht 63.0 in | Wt 209.4 lb

## 2024-10-05 DIAGNOSIS — I4819 Other persistent atrial fibrillation: Secondary | ICD-10-CM | POA: Insufficient documentation

## 2024-10-05 DIAGNOSIS — I440 Atrioventricular block, first degree: Secondary | ICD-10-CM | POA: Insufficient documentation

## 2024-10-05 DIAGNOSIS — I10 Essential (primary) hypertension: Secondary | ICD-10-CM | POA: Diagnosis not present

## 2024-10-05 DIAGNOSIS — Z79899 Other long term (current) drug therapy: Secondary | ICD-10-CM | POA: Diagnosis present

## 2024-10-05 DIAGNOSIS — I471 Supraventricular tachycardia, unspecified: Secondary | ICD-10-CM | POA: Diagnosis not present

## 2024-10-05 NOTE — Patient Instructions (Signed)
 Medication Instructions:   Your physician recommends that you continue on your current medications as directed. Please refer to the Current Medication list given to you today.    *If you need a refill on your cardiac medications before your next appointment, please call your pharmacy*  Lab Work: No labs ordered today  If you have labs (blood work) drawn today and your tests are completely normal, you will receive your results only by: MyChart Message (if you have MyChart) OR A paper copy in the mail If you have any lab test that is abnormal or we need to change your treatment, we will call you to review the results.  Testing/Procedures: No test ordered today   Follow-Up: At [facility], you and your health needs are our priority.  As part of our continuing mission to provide you with exceptional heart care, our providers are all part of one team.  This team includes your primary Cardiologist (physician) and Advanced Practice Providers or APPs (Physician Assistants and Nurse Practitioners) who all work together to provide you with the care you need, when you need it.  Your next appointment:   6 month(s)  Provider:   Suzann Riddle, NP    We recommend signing up for the patient portal called MyChart.  Sign up information is provided on this After Visit Summary.  MyChart is used to connect with patients for Virtual Visits (Telemedicine).  Patients are able to view lab/test results, encounter notes, upcoming appointments, etc.  Non-urgent messages can be sent to your provider as well.   To learn more about what you can do with MyChart, go to [email or web address].

## 2024-10-05 NOTE — Progress Notes (Signed)
 "     Electrophysiology Clinic Note    Date:  10/05/2024  Patient ID:  Diane Howe, Diane Howe Feb 15, 1956, MRN [id number] PCP:  Fernande Ophelia JINNY DOUGLAS, MD  Cardiologist:  None  Electrophysiologist:  Fonda Kitty, MD  Electrophysiology APP:  Isabelly Kobler, NP     Discussed the use of AI scribe software for clinical note transcription with the patient, who gave verbal consent to proceed.   Patient Profile    Chief Complaint: SVT follow-up  History of Present Illness: Diane Howe is a 69 y.o. female with PMH notable for persistent AFib, Aflutter, SVT w long RP, 1st deg HB, HTN; seen today for routine electrophysiology followup.   S/p EPS 2023 with ablation of many areas per notes, not able to ablate all d/t proximity to His bundle. She had recurrence of palpitations I last saw her 06/2024 where her palpitations were well-managed on  propafenone  and dilt, but EKG showed considerably longer 1st deg HB. Dilt was stopped and propafenone  dose reduced.   On follow-up today, she is doing very well with manageable palpitation burden.  She continues to take propafenone  to 25 mg twice daily.  She recently saw her PCP who started 2.5 mg amlodipine for high blood pressure and she has noticed an improvement in her blood pressure control since that time.    She continues to enjoy retirement life.    Arrhythmia/Device History Flecainide, stopped d/t lightheadedness Propafenone  started 06/2019     ROS:  Please see the history of present illness. All other systems are reviewed and otherwise negative.    Physical Exam    VS:  BP 132/72 (BP Location: Left Arm, Patient Position: Sitting, Cuff Size: Normal)   Pulse 72   Ht 5' 3 (1.6 m)   Wt 209 lb 6.4 oz (95 kg)   LMP 01/22/2016   SpO2 95%   BMI 37.09 kg/m  BMI: Body mass index is 37.09 kg/m.           Wt Readings from Last 3 Encounters:  10/05/24 209 lb 6.4 oz (95 kg)  06/30/24 211 lb (95.7 kg)  01/28/23 207 lb (93.9 kg)     GEN-  The patient is well appearing, alert and oriented x 3 today.   Lungs- Clear to ausculation bilaterally, normal work of breathing.  Heart- Regular rate and rhythm, no murmurs, rubs or gallops Extremities- Trace peripheral edema, warm, dry   Studies Reviewed   Previous EP, cardiology notes.    EKG is ordered. Personal review of EKG from today shows:    EKG Interpretation Date/Time:  Thursday October 05 2024 13:50:50 EST Ventricular Rate:  72 PR Interval:  244 QRS Duration:  108 QT Interval:  418 QTC Calculation: 457 R Axis:   24  Text Interpretation: Sinus rhythm with 1st degree A-V block Confirmed by Akirah Storck [phone]) on 10/05/2024 1:54:13 PM    06/2024 EKG - SR w 1st deg HB, rate 66; PR 01/2023 EKG - SR w 1st deg HB at 68. PR 230 12/2021 EKG - SR w 1st deg HB at 66. PR 270   ETT, 08/17/2019 Blood pressure demonstrated a normal response to exercise. There was no ST segment deviation noted during stress.   TTE, 01/02/2016 - Left ventricle: The cavity size was normal. Systolic function was normal. The estimated ejection fraction was in the range of 60% to 65%. Wall motion was normal; there were no regional wall motion abnormalities. Left ventricular diastolic function parameters were normal.  -  Mitral valve: There was mild regurgitation.  - Left atrium: The atrium was normal in size.  - Right ventricle: Systolic function was normal.  - Pulmonary arteries: Systolic pressure was within the normal range.    Assessment and Plan     #) persis AFib, aflutter #) SVT #) 1st deg HB #) propafenone  monitoring S/p SVT ablation of ATach Well-controlled palpitation burden with very short, seconds-long episodes First-degree heart block considerably improved compared to prior.   Continue propafenone  225 mg twice daily.     #) Hypercoag d/t persis afib CHA2DS2-VASc Score = at least 3 [CHF History: 0, HTN History: 1, Diabetes History: 0, Stroke History: 0, Vascular Disease  History: 0, Age Score: 1, Gender Score: 1].  Therefore, the patient's annual risk of stroke is 3.2 %.    Stroke ppx - 81mg  ASA   #) HTN Well controlled by home readings Continue 2.5mg  amlodipine         Current medicines are reviewed at length with the patient today.   The patient does not have concerns regarding her medicines.  The following changes were made today:   none  Labs/ tests ordered today include:  Orders Placed This Encounter  Procedures   EKG 12-Lead     Disposition: Follow up with Dr. Kennyth or EP APP in 6 months   Signed, Shigeko Manard, NP  10/05/2024  4:46 PM  Electrophysiology [facility] "
